# Patient Record
Sex: Female | Born: 1995 | Race: Asian | Hispanic: No | Marital: Married | Smoking: Never smoker
Health system: Southern US, Community
[De-identification: ages and names within clinical notes are randomized; demographics above are authoritative.]

## PROBLEM LIST (undated history)

## (undated) DIAGNOSIS — O24419 Gestational diabetes mellitus in pregnancy, unspecified control: Secondary | ICD-10-CM

## (undated) DIAGNOSIS — E119 Type 2 diabetes mellitus without complications: Secondary | ICD-10-CM

## (undated) DIAGNOSIS — O139 Gestational [pregnancy-induced] hypertension without significant proteinuria, unspecified trimester: Secondary | ICD-10-CM

## (undated) DIAGNOSIS — E282 Polycystic ovarian syndrome: Secondary | ICD-10-CM

## (undated) DIAGNOSIS — N979 Female infertility, unspecified: Secondary | ICD-10-CM

## (undated) HISTORY — DX: Female infertility, unspecified: N97.9

## (undated) HISTORY — DX: Type 2 diabetes mellitus without complications: E11.9

## (undated) HISTORY — PX: CERVICAL CERCLAGE: SHX1329

## (undated) SURGERY — SALPINGECTOMY, UNILATERAL, LAPAROSCOPIC
Anesthesia: General | Laterality: Right

---

## 2019-10-25 ENCOUNTER — Other Ambulatory Visit: Payer: Self-pay

## 2019-10-25 ENCOUNTER — Ambulatory Visit (INDEPENDENT_AMBULATORY_CARE_PROVIDER_SITE_OTHER): Payer: Self-pay | Admitting: Family Medicine

## 2019-10-25 VITALS — BP 122/62 | HR 90 | Wt 174.8 lb

## 2019-10-25 DIAGNOSIS — N926 Irregular menstruation, unspecified: Secondary | ICD-10-CM

## 2019-10-25 DIAGNOSIS — Z7689 Persons encountering health services in other specified circumstances: Secondary | ICD-10-CM

## 2019-10-25 LAB — POCT URINE PREGNANCY: Preg Test, Ur: NEGATIVE

## 2019-10-25 MED ORDER — FOLIC ACID 1 MG PO TABS: 1.0000 mg | ORAL_TABLET | Freq: Every day | ORAL | 3 refills | Status: DC

## 2019-10-25 NOTE — Patient Instructions (Addendum)
Thank you for coming to see me today. It was a pleasure. Today we talked about:   You will have some blood work today.  We will discuss them at your next visit. We will schedule you for an pelvic ultrasound Please book an appointment for a PAP smear with me next week.  I have ordered Folic Acid for you to take daily. You can start Prenatal Vitamins but this can wait until we have completed our evaluation  If you have any questions or concerns, please do not hesitate to call the office at [phone].  Best,   Dana Allan, MD Family Medicine Residency

## 2019-10-25 NOTE — Progress Notes (Signed)
    SUBJECTIVE:   CHIEF COMPLAINT / HPI: To establish care and would like work-up for infertility.  Patient has no acute concerns today.  Secondary amenorrhea Patient reports menses at age 24 to 24 years.  Reports regular periods lasting 6 to 7 days until the age of 24 when became more irregular.  Longest time gone without having bleeding approximately 2 months.  She reports her and her husband have been trying to get pregnant for over a year now.  Per chart review she was seen by OBGYN 2019 for same concern.  Work-up at that time showed elevated prolactin levels and MRI head was ordered but never completed.  Was ordered progesterone at that time but had only taking it for about 7 days.  She reports recent weight gain of about 3 kg over the past 1-2 years.  Denies any hirsutism, dysuria, or abdominal pain.  Sexually active with husband.  No history of sexual transmitted infections.  PERTINENT  PMH / PSH:  No past medical history No surgical history OB history as above No tobacco use.  Drinks a glass of wine socially. OBJECTIVE:   BP 122/62   Pulse 90   Wt 174 lb 12.8 oz (79.3 kg)   SpO2 98%    General: Alert and oriented, no apparent distress  Eyes: PEERLA ENTM: No pharyengeal erythema Neck: nontender, no goiter appreciated Cardiovascular: RRR with no murmurs noted Respiratory: CTA bilaterally  Gastrointestinal: Bowel sounds present. No abdominal pain MSK: Upper extremity strength 5/5 bilaterally, Lower extremity strength 5/5 bilaterally  Derm: No rashes noted Psych: Behavior and speech appropriate to situation  ASSESSMENT/PLAN:   Encounter to establish care Benign exam. -Urinary pregnancy test negative -Labs today  TSH, FSH/LH, prolactin level -Transvaginal ultrasound -Folate acid daily -Advised patient make ointment for Pap smear next week. -Progestin challenge test at visit -Consider semen analysis and hysterosalpingogram after initial work-up.     Dana Allan,  MD [facility]

## 2019-10-26 LAB — FSH/LH
FSH: 2.2 m[IU]/mL
LH: 17.3 m[IU]/mL

## 2019-10-26 LAB — PROLACTIN: Prolactin: 37.7 ng/mL — ABNORMAL HIGH (ref 4.8–23.3)

## 2019-10-26 LAB — TSH: TSH: 2.44 u[IU]/mL (ref 0.450–4.500)

## 2019-10-29 ENCOUNTER — Encounter: Payer: Self-pay | Admitting: Family Medicine

## 2019-10-29 DIAGNOSIS — Z7689 Persons encountering health services in other specified circumstances: Secondary | ICD-10-CM | POA: Insufficient documentation

## 2019-10-29 NOTE — Assessment & Plan Note (Signed)
Benign exam. -Urinary pregnancy test negative -Labs today  TSH, FSH/LH, prolactin level -Transvaginal ultrasound -Folate acid daily -Advised patient make ointment for Pap smear next week. -Progestin challenge test at visit -Consider semen analysis and hysterosalpingogram after initial work-up.

## 2019-11-01 ENCOUNTER — Ambulatory Visit (HOSPITAL_COMMUNITY)
Admission: RE | Admit: 2019-11-01 | Discharge: 2019-11-01 | Disposition: A | Discharge status: Home / Self Care | Payer: 59 | Source: Ambulatory Visit | Attending: Family Medicine | Admitting: Family Medicine

## 2019-11-01 DIAGNOSIS — N926 Irregular menstruation, unspecified: Secondary | ICD-10-CM | POA: Diagnosis present

## 2019-11-05 NOTE — Progress Notes (Signed)
° ° °  SUBJECTIVE:   CHIEF COMPLAINT / HPI: For Pap Smear  No acute concerns.  Denies any vaginal bleeding or discharge.  Recent pregnany test negative 2 weeks ago.  Since then has had period.    Elevated Prolactin Results of labs and recent pelvic u/s discussed.  Prolactin slightly above normal range.  Patient denies any headaches or visual disturbances.  Ultrasound shows mild ovarian enlargement bilaterally with multiple peripherally oriented follicles, which can be seen in the setting of PCOS.  PERTINENT  PMH / PSH:  Irregular menses Elevated prolactin  OBJECTIVE:   BP 100/80    Pulse 84    Ht 5' 3.98" (1.625 m)    Wt 175 lb 6.4 oz (79.6 kg)    LMP 09/17/2019 (Approximate)    SpO2 99%    BMI 30.13 kg/m   General: Alert and oriented, no apparent distress  Gastrointestinal: Bowel sounds present. No abdominal pain EVE: No lesions or abrasions noted SVE: Cervix normal with normal vaginal mucosa Bimanual: No CMT or adnexal masses  ASSESSMENT/PLAN:   Cervical cancer screening -PAP for cytology -GC/C -Repeat in 3 years -Follow up as needed   Elevated prolactin level Prolactin slightly above normal in the past 2 years.  Visual acuity 20/20.  Unable to achieve pregnancy. -MRI brain to r/o adenoma -Follow up with results     Dana Allan, MD [facility]

## 2019-11-05 NOTE — Patient Instructions (Addendum)
Thank you for coming to see me today. It was a pleasure.    Your Prolactin levels are elevated.  We will order a scan of your head to check the pituitary gland.  Please follow-up with PCP in 4 weeks  If you have any questions or concerns, please do not hesitate to call the office at [phone].  Best,   Dana Allan, MD Family Medicine Residency    Polycystic Ovarian Syndrome  Polycystic ovarian syndrome (PCOS) is a common hormonal disorder among women of reproductive age. In most women with PCOS, many small fluid-filled sacs (cysts) grow on the ovaries, and the cysts are not part of a normal menstrual cycle. PCOS can cause problems with your menstrual periods and make it difficult to get pregnant. It can also cause an increased risk of miscarriage with pregnancy. If it is not treated, PCOS can lead to serious health problems, such as diabetes and heart disease. What are the causes? The cause of PCOS is not known, but it may be the result of a combination of certain factors, such as:  Irregular menstrual cycle.  High levels of certain hormones (androgens).  Problems with the hormone that helps to control blood sugar (insulin resistance).  Certain genes. What increases the risk? This condition is more likely to develop in women who have a family history of PCOS. What are the signs or symptoms? Symptoms of PCOS may include:  Multiple ovarian cysts.  Infrequent periods or no periods.  Periods that are too frequent or too heavy.  Unpredictable periods.  Inability to get pregnant (infertility) because of not ovulating.  Increased growth of hair on the face, chest, stomach, back, thumbs, thighs, or toes.  Acne or oily skin. Acne may develop during adulthood, and it may not respond to treatment.  Pelvic pain.  Weight gain or obesity.  Patches of thickened and dark brown or black skin on the neck, arms, breasts, or thighs (acanthosis nigricans).  Excess hair growth on  the face, chest, abdomen, or upper thighs (hirsutism). How is this diagnosed? This condition is diagnosed based on:  Your medical history.  A physical exam, including a pelvic exam. Your health care provider may look for areas of increased hair growth on your skin.  Tests, such as: ? Ultrasound. This may be used to examine the ovaries and the lining of the uterus (endometrium) for cysts. ? Blood tests. These may be used to check levels of sugar (glucose), female hormone (testosterone), and female hormones (estrogen and progesterone) in your blood. How is this treated? There is no cure for PCOS, but treatment can help to manage symptoms and prevent more health problems from developing. Treatment varies depending on:  Your symptoms.  Whether you want to have a baby or whether you need birth control (contraception). Treatment may include nutrition and lifestyle changes along with:  Progesterone hormone to start a menstrual period.  Birth control pills to help you have regular menstrual periods.  Medicines to make you ovulate, if you want to get pregnant.  Medicine to reduce excessive hair growth.  Surgery, in severe cases. This may involve making small holes in one or both of your ovaries. This decreases the amount of testosterone that your body produces. Follow these instructions at home:  Take over-the-counter and prescription medicines only as told by your health care provider.  Follow a healthy meal plan. This can help you reduce the effects of PCOS. ? Eat a healthy diet that includes lean proteins, complex carbohydrates,  fresh fruits and vegetables, low-fat dairy products, and healthy fats. Make sure to eat enough fiber.  If you are overweight, lose weight as told by your health care provider. ? Losing 10% of your body weight may improve symptoms. ? Your health care provider can determine how much weight loss is best for you and can help you lose weight safely.  Keep all  follow-up visits as told by your health care provider. This is important. Contact a health care provider if:  Your symptoms do not get better with medicine.  You develop new symptoms. This information is not intended to replace advice given to you by your health care provider. Make sure you discuss any questions you have with your health care provider. Document Revised: 03/17/2017 Document Reviewed: 09/20/2015 Elsevier Patient Education  2020 [employer]   Diet for Polycystic Ovary Syndrome Polycystic ovary syndrome (PCOS) is a disorder of the chemicals (hormones) that regulate a woman's reproductive system, including monthly periods (menstruation). The condition causes important hormones to be out of balance. PCOS can:  Stop your periods or make them irregular.  Cause cysts to develop on your ovaries.  Make it difficult to get pregnant.  Stop your body from responding to the effects of insulin (insulin resistance). Insulin resistance can lead to obesity and diabetes. Changing what you eat can help you manage PCOS and improve your health. Following a balanced diet can help you lose weight and improve the way that your body uses insulin. What are tips for following this plan?  Follow a balanced diet for meals and snacks. Eat breakfast, lunch, dinner, and one or two snacks every day.  Include protein in each meal and snack.  Choose whole grains instead of products that are made with refined flour.  Eat a variety of foods.  Exercise regularly as told by your health care provider. Aim to do 30 or more minutes of exercise on most days of the week.  If you are overweight or obese: ? Pay attention to how many calories you eat. Cutting down on calories can help you lose weight. ? Work with your health care provider or a diet and nutrition specialist (dietitian) to figure out how many calories you need each day. What foods can I eat?  Fruits Include a variety of colors and types.  All fruits are helpful for PCOS. Vegetables Include a variety of colors and types. All vegetables are helpful for PCOS. Grains Whole grains, such as whole wheat. Whole-grain breads, crackers, cereals, and pasta. Unsweetened oatmeal, bulgur, barley, quinoa, and brown rice. Tortillas made from corn or whole-wheat flour. Meats and other proteins Low-fat (lean) proteins, such as fish, chicken, beans, eggs, and tofu. Dairy Low-fat dairy products, such as skim milk, cheese sticks, and yogurt. Beverages Low-fat or fat-free drinks, such as water, low-fat milk, sugar-free drinks, and small amounts of 100% fruit juice. Seasonings and condiments Ketchup. Mustard. Barbecue sauce. Relish. Low-fat or fat-free mayonnaise. Fats and oils Olive oil or canola oil. Walnuts and almonds. The items listed above may not be a complete list of recommended foods and beverages. Contact a dietitian for more options. What foods are not recommended? Foods that are high in calories or fat. Fried foods. Sweets. Products that are made from refined white flour, including white bread, pastries, white rice, and pasta. The items listed above may not be a complete list of foods and beverages to avoid. Contact a dietitian for more information. Summary  PCOS is a hormonal imbalance that affects a  woman's reproductive system.  You can help to manage your PCOS by exercising regularly and eating a healthy, varied diet of vegetables, fruit, whole grains, low-fat (lean) protein, and low-fat dairy products.  Changing what you eat can improve the way that your body uses insulin, help your hormones reach normal levels, and help you lose weight. This information is not intended to replace advice given to you by your health care provider. Make sure you discuss any questions you have with your health care provider. Document Revised: 07/25/2018 Document Reviewed: 02/06/2017 Elsevier Patient Education  2020 [employer].

## 2019-11-06 ENCOUNTER — Other Ambulatory Visit (HOSPITAL_COMMUNITY)
Admission: RE | Admit: 2019-11-06 | Discharge: 2019-11-06 | Disposition: A | Discharge status: Home / Self Care | Payer: 59 | Source: Ambulatory Visit | Attending: Family Medicine | Admitting: Family Medicine

## 2019-11-06 ENCOUNTER — Ambulatory Visit (INDEPENDENT_AMBULATORY_CARE_PROVIDER_SITE_OTHER): Payer: 59 | Admitting: Family Medicine

## 2019-11-06 ENCOUNTER — Other Ambulatory Visit: Payer: Self-pay

## 2019-11-06 VITALS — BP 100/80 | HR 84 | Ht 63.98 in | Wt 175.4 lb

## 2019-11-06 DIAGNOSIS — Z124 Encounter for screening for malignant neoplasm of cervix: Secondary | ICD-10-CM

## 2019-11-06 DIAGNOSIS — R7989 Other specified abnormal findings of blood chemistry: Secondary | ICD-10-CM

## 2019-11-06 DIAGNOSIS — Z0000 Encounter for general adult medical examination without abnormal findings: Secondary | ICD-10-CM | POA: Diagnosis not present

## 2019-11-06 DIAGNOSIS — E282 Polycystic ovarian syndrome: Secondary | ICD-10-CM

## 2019-11-07 LAB — HCV INTERPRETATION

## 2019-11-07 LAB — HCV AB W REFLEX TO QUANT PCR: HCV Ab: <0.1 s/co ratio (ref 0.0–0.9)

## 2019-11-07 LAB — HIV ANTIBODY (ROUTINE TESTING W REFLEX): HIV Screen 4th Generation wRfx: NONREACTIVE

## 2019-11-08 LAB — CYTOLOGY - PAP
Chlamydia: NEGATIVE
Comment: NEGATIVE
Comment: NEGATIVE
Comment: NORMAL
Diagnosis: NEGATIVE
Neisseria Gonorrhea: NEGATIVE
Trichomonas: NEGATIVE

## 2019-11-10 DIAGNOSIS — R7989 Other specified abnormal findings of blood chemistry: Secondary | ICD-10-CM | POA: Insufficient documentation

## 2019-11-10 DIAGNOSIS — E282 Polycystic ovarian syndrome: Secondary | ICD-10-CM | POA: Insufficient documentation

## 2019-11-10 DIAGNOSIS — Z124 Encounter for screening for malignant neoplasm of cervix: Secondary | ICD-10-CM | POA: Insufficient documentation

## 2019-11-10 NOTE — Assessment & Plan Note (Signed)
Prolactin slightly above normal in the past 2 years.  Visual acuity 20/20.  Unable to achieve pregnancy. -MRI brain to r/o adenoma -Follow up with results

## 2019-11-10 NOTE — Assessment & Plan Note (Signed)
-  PAP for cytology -GC/C -Repeat in 3 years -Follow up as needed

## 2019-12-09 ENCOUNTER — Ambulatory Visit (HOSPITAL_COMMUNITY)
Admission: AD | Admit: 2019-12-09 | Discharge: 2019-12-09 | Disposition: A | Discharge status: Home / Self Care | Payer: 59 | Attending: Obstetrics and Gynecology | Admitting: Obstetrics and Gynecology

## 2019-12-09 ENCOUNTER — Encounter (HOSPITAL_COMMUNITY)
Admission: AD | Disposition: A | Discharge status: Home / Self Care | Payer: Self-pay | Source: Home / Self Care | Attending: Emergency Medicine

## 2019-12-09 ENCOUNTER — Other Ambulatory Visit: Payer: Self-pay

## 2019-12-09 ENCOUNTER — Inpatient Hospital Stay (HOSPITAL_COMMUNITY): Payer: 59 | Admitting: Anesthesiology

## 2019-12-09 ENCOUNTER — Inpatient Hospital Stay (HOSPITAL_COMMUNITY): Payer: 59

## 2019-12-09 ENCOUNTER — Encounter (HOSPITAL_COMMUNITY): Payer: Self-pay

## 2019-12-09 ENCOUNTER — Ambulatory Visit: Payer: 59 | Admitting: Family Medicine

## 2019-12-09 DIAGNOSIS — Z20822 Contact with and (suspected) exposure to covid-19: Secondary | ICD-10-CM | POA: Insufficient documentation

## 2019-12-09 DIAGNOSIS — Z3A01 Less than 8 weeks gestation of pregnancy: Secondary | ICD-10-CM | POA: Diagnosis not present

## 2019-12-09 DIAGNOSIS — Z3201 Encounter for pregnancy test, result positive: Secondary | ICD-10-CM | POA: Diagnosis present

## 2019-12-09 DIAGNOSIS — O00101 Right tubal pregnancy without intrauterine pregnancy: Secondary | ICD-10-CM | POA: Diagnosis not present

## 2019-12-09 DIAGNOSIS — Z8759 Personal history of other complications of pregnancy, childbirth and the puerperium: Secondary | ICD-10-CM

## 2019-12-09 DIAGNOSIS — R102 Pelvic and perineal pain: Secondary | ICD-10-CM

## 2019-12-09 DIAGNOSIS — E282 Polycystic ovarian syndrome: Secondary | ICD-10-CM | POA: Insufficient documentation

## 2019-12-09 DIAGNOSIS — K661 Hemoperitoneum: Secondary | ICD-10-CM | POA: Diagnosis not present

## 2019-12-09 DIAGNOSIS — R1030 Lower abdominal pain, unspecified: Secondary | ICD-10-CM | POA: Diagnosis present

## 2019-12-09 DIAGNOSIS — O009 Unspecified ectopic pregnancy without intrauterine pregnancy: Secondary | ICD-10-CM

## 2019-12-09 DIAGNOSIS — O26891 Other specified pregnancy related conditions, first trimester: Secondary | ICD-10-CM | POA: Insufficient documentation

## 2019-12-09 DIAGNOSIS — O99281 Endocrine, nutritional and metabolic diseases complicating pregnancy, first trimester: Secondary | ICD-10-CM | POA: Diagnosis not present

## 2019-12-09 HISTORY — PX: LAPAROSCOPIC UNILATERAL SALPINGECTOMY: SHX5934

## 2019-12-09 HISTORY — PX: DIAGNOSTIC LAPAROSCOPY WITH REMOVAL OF ECTOPIC PREGNANCY: SHX6449

## 2019-12-09 HISTORY — DX: Polycystic ovarian syndrome: E28.2

## 2019-12-09 LAB — COMPREHENSIVE METABOLIC PANEL
ALT: 18 U/L (ref 0–44)
AST: 18 U/L (ref 15–41)
Albumin: 3.8 g/dL (ref 3.5–5.0)
Alkaline Phosphatase: 42 U/L (ref 38–126)
Anion gap: 9 (ref 5–15)
BUN: 5 mg/dL — ABNORMAL LOW (ref 6–20)
CO2: 21 mmol/L — ABNORMAL LOW (ref 22–32)
Calcium: 9 mg/dL (ref 8.9–10.3)
Chloride: 109 mmol/L (ref 98–111)
Creatinine, Ser: 0.47 mg/dL (ref 0.44–1.00)
GFR calc Af Amer: >60 mL/min (ref >60)
GFR calc non Af Amer: >60 mL/min (ref >60)
Glucose, Bld: 123 mg/dL — ABNORMAL HIGH (ref 70–99)
Potassium: 3.9 mmol/L (ref 3.5–5.1)
Sodium: 139 mmol/L (ref 135–145)
Total Bilirubin: 0.7 mg/dL (ref 0.3–1.2)
Total Protein: 6.8 g/dL (ref 6.5–8.1)

## 2019-12-09 LAB — WET PREP, GENITAL
Clue Cells Wet Prep HPF POC: NONE SEEN
Sperm: NONE SEEN
Trich, Wet Prep: NONE SEEN
Yeast Wet Prep HPF POC: NONE SEEN

## 2019-12-09 LAB — TYPE AND SCREEN
ABO/RH(D): B POS
Antibody Screen: NEGATIVE

## 2019-12-09 LAB — URINALYSIS, ROUTINE W REFLEX MICROSCOPIC
Bilirubin Urine: NEGATIVE
Glucose, UA: NEGATIVE mg/dL
Ketones, ur: NEGATIVE mg/dL
Nitrite: NEGATIVE
Protein, ur: 30 mg/dL — AB
Specific Gravity, Urine: 1.025 (ref 1.005–1.030)
pH: 7 (ref 5.0–8.0)

## 2019-12-09 LAB — URINALYSIS, MICROSCOPIC (REFLEX): WBC, UA: >50 WBC/hpf (ref 0–5)

## 2019-12-09 LAB — LIPASE, BLOOD: Lipase: 26 U/L (ref 11–51)

## 2019-12-09 LAB — I-STAT BETA HCG BLOOD, ED (MC, WL, AP ONLY): I-stat hCG, quantitative: >2000 m[IU]/mL — ABNORMAL HIGH (ref <5)

## 2019-12-09 LAB — HCG, QUANTITATIVE, PREGNANCY: hCG, Beta Chain, Quant, S: 18318 m[IU]/mL — ABNORMAL HIGH

## 2019-12-09 LAB — CBC
HCT: 32.8 % — ABNORMAL LOW (ref 36.0–46.0)
Hemoglobin: 10.7 g/dL — ABNORMAL LOW (ref 12.0–15.0)
MCH: 27.6 pg (ref 26.0–34.0)
MCHC: 32.6 g/dL (ref 30.0–36.0)
MCV: 84.8 fL (ref 80.0–100.0)
Platelets: 356 K/uL (ref 150–400)
RBC: 3.87 MIL/uL (ref 3.87–5.11)
RDW: 13 % (ref 11.5–15.5)
WBC: 14 K/uL — ABNORMAL HIGH (ref 4.0–10.5)
nRBC: 0 % (ref 0.0–0.2)

## 2019-12-09 LAB — SARS CORONAVIRUS 2 BY RT PCR (HOSPITAL ORDER, PERFORMED IN ~~LOC~~ HOSPITAL LAB): SARS Coronavirus 2: NEGATIVE

## 2019-12-09 LAB — ABO/RH: ABO/RH(D): B POS

## 2019-12-09 SURGERY — LAPAROSCOPY, WITH ECTOPIC PREGNANCY SURGICAL TREATMENT
Anesthesia: General | Laterality: Right

## 2019-12-09 MED ORDER — BUPIVACAINE HCL 0.25 % IJ SOLN
INTRAMUSCULAR | Status: DC | PRN
  Administered 2019-12-09: 14 mL

## 2019-12-09 MED ORDER — SUFENTANIL CITRATE 50 MCG/ML IV SOLN
INTRAVENOUS | Status: AC
  Filled 2019-12-09: qty 1

## 2019-12-09 MED ORDER — AMISULPRIDE (ANTIEMETIC) 5 MG/2ML IV SOLN: 10.0000 mg | Freq: Once | INTRAVENOUS | Status: DC | PRN

## 2019-12-09 MED ORDER — POVIDONE-IODINE 10 % EX SWAB: 2.0000 "application " | Freq: Once | CUTANEOUS | Status: DC

## 2019-12-09 MED ORDER — SODIUM CHLORIDE 0.9 % IR SOLN
Status: DC | PRN
  Administered 2019-12-09: 1000 mL

## 2019-12-09 MED ORDER — GLYCOPYRROLATE 0.2 MG/ML IJ SOLN
INTRAMUSCULAR | Status: DC | PRN
  Administered 2019-12-09: .2 mg via INTRAVENOUS

## 2019-12-09 MED ORDER — BUPIVACAINE HCL (PF) 0.25 % IJ SOLN
INTRAMUSCULAR | Status: AC
  Filled 2019-12-09: qty 30

## 2019-12-09 MED ORDER — ROCURONIUM 10MG/ML (10ML) SYRINGE FOR MEDFUSION PUMP - OPTIME
INTRAVENOUS | Status: DC | PRN
  Administered 2019-12-09: 50 mg via INTRAVENOUS

## 2019-12-09 MED ORDER — SCOPOLAMINE 1 MG/3DAYS TD PT72
MEDICATED_PATCH | TRANSDERMAL | Status: AC
  Administered 2019-12-09: 1.5 mg via TRANSDERMAL
  Filled 2019-12-09: qty 1

## 2019-12-09 MED ORDER — ACETAMINOPHEN 500 MG PO TABS
ORAL_TABLET | ORAL | Status: AC
  Administered 2019-12-09: 1000 mg via ORAL
  Filled 2019-12-09: qty 2

## 2019-12-09 MED ORDER — MIDAZOLAM HCL 2 MG/2ML IJ SOLN
INTRAMUSCULAR | Status: AC
  Filled 2019-12-09: qty 2

## 2019-12-09 MED ORDER — ONDANSETRON HCL 4 MG/2ML IJ SOLN
INTRAMUSCULAR | Status: DC | PRN
  Administered 2019-12-09: 4 mg via INTRAVENOUS

## 2019-12-09 MED ORDER — SUFENTANIL CITRATE 50 MCG/ML IV SOLN
INTRAVENOUS | Status: DC | PRN
Start: 2019-12-09 — End: 2019-12-09
  Administered 2019-12-09 (×2): 10 ug via INTRAVENOUS
  Administered 2019-12-09: 20 ug via INTRAVENOUS

## 2019-12-09 MED ORDER — CHLORHEXIDINE GLUCONATE 0.12 % MT SOLN: 15.0000 mL | Freq: Once | OROMUCOSAL | Status: AC

## 2019-12-09 MED ORDER — MIDAZOLAM HCL 5 MG/5ML IJ SOLN
INTRAMUSCULAR | Status: DC | PRN
  Administered 2019-12-09: 2 mg via INTRAVENOUS

## 2019-12-09 MED ORDER — FENTANYL CITRATE (PF) 100 MCG/2ML IJ SOLN: 25.0000 ug | INTRAMUSCULAR | Status: DC | PRN

## 2019-12-09 MED ORDER — LACTATED RINGERS IV SOLN: Freq: Once | INTRAVENOUS | Status: AC

## 2019-12-09 MED ORDER — HYDROCODONE-ACETAMINOPHEN 7.5-325 MG PO TABS: 1.0000 | ORAL_TABLET | Freq: Four times a day (QID) | ORAL | 0 refills | Status: AC | PRN

## 2019-12-09 MED ORDER — LACTATED RINGERS IV SOLN: INTRAVENOUS | Status: DC

## 2019-12-09 MED ORDER — SUGAMMADEX SODIUM 200 MG/2ML IV SOLN
INTRAVENOUS | Status: DC | PRN
  Administered 2019-12-09: 200 mg via INTRAVENOUS

## 2019-12-09 MED ORDER — LACTATED RINGERS IV SOLN: INTRAVENOUS | Status: DC | PRN

## 2019-12-09 MED ORDER — LIDOCAINE HCL (CARDIAC) PF 100 MG/5ML IV SOSY
PREFILLED_SYRINGE | INTRAVENOUS | Status: DC | PRN
  Administered 2019-12-09: 100 mg via INTRAVENOUS

## 2019-12-09 MED ORDER — IBUPROFEN 600 MG PO TABS: 600.0000 mg | ORAL_TABLET | Freq: Four times a day (QID) | ORAL | 1 refills | Status: DC | PRN

## 2019-12-09 MED ORDER — ACETAMINOPHEN 500 MG PO TABS: 1000.0000 mg | ORAL_TABLET | ORAL | Status: AC

## 2019-12-09 MED ORDER — PROPOFOL 10 MG/ML IV BOLUS
INTRAVENOUS | Status: AC
  Filled 2019-12-09: qty 20

## 2019-12-09 MED ORDER — GLYCOPYRROLATE PF 0.2 MG/ML IJ SOSY
PREFILLED_SYRINGE | INTRAMUSCULAR | Status: AC
  Filled 2019-12-09: qty 1

## 2019-12-09 MED ORDER — SOD CITRATE-CITRIC ACID 500-334 MG/5ML PO SOLN: 30.0000 mL | ORAL | Status: AC

## 2019-12-09 MED ORDER — LIDOCAINE 2% (20 MG/ML) 5 ML SYRINGE
INTRAMUSCULAR | Status: AC
  Filled 2019-12-09: qty 5

## 2019-12-09 MED ORDER — ONDANSETRON HCL 4 MG/2ML IJ SOLN
INTRAMUSCULAR | Status: AC
  Filled 2019-12-09: qty 2

## 2019-12-09 MED ORDER — CEFAZOLIN SODIUM-DEXTROSE 2-4 GM/100ML-% IV SOLN
INTRAVENOUS | Status: AC
  Filled 2019-12-09: qty 100

## 2019-12-09 MED ORDER — ROCURONIUM BROMIDE 10 MG/ML (PF) SYRINGE
PREFILLED_SYRINGE | INTRAVENOUS | Status: AC
  Filled 2019-12-09: qty 10

## 2019-12-09 MED ORDER — SODIUM CHLORIDE (PF) 0.9 % IJ SOLN
INTRAMUSCULAR | Status: AC
  Filled 2019-12-09: qty 10

## 2019-12-09 MED ORDER — DEXAMETHASONE SODIUM PHOSPHATE 10 MG/ML IJ SOLN
INTRAMUSCULAR | Status: AC
  Filled 2019-12-09: qty 1

## 2019-12-09 MED ORDER — SOD CITRATE-CITRIC ACID 500-334 MG/5ML PO SOLN
ORAL | Status: AC
  Administered 2019-12-09: 30 mL via ORAL
  Filled 2019-12-09: qty 30

## 2019-12-09 MED ORDER — CHLORHEXIDINE GLUCONATE 0.12 % MT SOLN
OROMUCOSAL | Status: AC
  Administered 2019-12-09: 15 mL via OROMUCOSAL
  Filled 2019-12-09: qty 15

## 2019-12-09 MED ORDER — CEFAZOLIN SODIUM-DEXTROSE 2-4 GM/100ML-% IV SOLN
2.0000 g | INTRAVENOUS | Status: AC
  Administered 2019-12-09: 2 g via INTRAVENOUS

## 2019-12-09 MED ORDER — DEXAMETHASONE SODIUM PHOSPHATE 10 MG/ML IJ SOLN
INTRAMUSCULAR | Status: DC | PRN
  Administered 2019-12-09: 10 mg via INTRAVENOUS

## 2019-12-09 MED ORDER — SCOPOLAMINE 1 MG/3DAYS TD PT72: 1.0000 | MEDICATED_PATCH | TRANSDERMAL | Status: DC

## 2019-12-09 MED ORDER — PROPOFOL 10 MG/ML IV BOLUS
INTRAVENOUS | Status: DC | PRN
  Administered 2019-12-09: 150 mg via INTRAVENOUS

## 2019-12-09 SURGICAL SUPPLY — 33 items
APPLICATOR ARISTA FLEXITIP XL (MISCELLANEOUS) IMPLANT
DERMABOND ADVANCED (GAUZE/BANDAGES/DRESSINGS) ×2
DERMABOND ADVANCED .7 DNX12 (GAUZE/BANDAGES/DRESSINGS) ×2 IMPLANT
DRSG OPSITE POSTOP 3X4 (GAUZE/BANDAGES/DRESSINGS) ×4 IMPLANT
DURAPREP 26ML APPLICATOR (WOUND CARE) ×4 IMPLANT
GLOVE BIO SURGEON STRL SZ8 (GLOVE) ×4 IMPLANT
GLOVE BIOGEL PI IND STRL 7.0 (GLOVE) ×4 IMPLANT
GLOVE BIOGEL PI IND STRL 8 (GLOVE) ×2 IMPLANT
GLOVE BIOGEL PI INDICATOR 7.0 (GLOVE) ×4
GLOVE BIOGEL PI INDICATOR 8 (GLOVE) ×2
GOWN STRL REUS W/ TWL LRG LVL3 (GOWN DISPOSABLE) ×2 IMPLANT
GOWN STRL REUS W/ TWL XL LVL3 (GOWN DISPOSABLE) ×2 IMPLANT
GOWN STRL REUS W/TWL LRG LVL3 (GOWN DISPOSABLE) ×2
GOWN STRL REUS W/TWL XL LVL3 (GOWN DISPOSABLE) ×2
HEMOSTAT ARISTA ABSORB 3G PWDR (HEMOSTASIS) IMPLANT
KIT TURNOVER KIT B (KITS) ×4 IMPLANT
LIGASURE VESSEL 5MM BLUNT TIP (ELECTROSURGICAL) ×4 IMPLANT
NS IRRIG 1000ML POUR BTL (IV SOLUTION) ×4 IMPLANT
PACK LAPAROSCOPY BASIN (CUSTOM PROCEDURE TRAY) ×4 IMPLANT
PACK TRENDGUARD 450 HYBRID PRO (MISCELLANEOUS) ×2 IMPLANT
POUCH SPECIMEN RETRIEVAL 10MM (ENDOMECHANICALS) IMPLANT
PROTECTOR NERVE ULNAR (MISCELLANEOUS) ×8 IMPLANT
SET IRRIG TUBING LAPAROSCOPIC (IRRIGATION / IRRIGATOR) ×4 IMPLANT
SET TUBE SMOKE EVAC HIGH FLOW (TUBING) ×4 IMPLANT
SLEEVE XCEL OPT CAN 5 100 (ENDOMECHANICALS) ×4 IMPLANT
SUT MNCRL AB 4-0 PS2 18 (SUTURE) ×4 IMPLANT
SUT VICRYL 0 UR6 27IN ABS (SUTURE) ×4 IMPLANT
TOWEL GREEN STERILE FF (TOWEL DISPOSABLE) ×8 IMPLANT
TRAY FOLEY W/BAG SLVR 14FR (SET/KITS/TRAYS/PACK) ×4 IMPLANT
TRENDGUARD 450 HYBRID PRO PACK (MISCELLANEOUS) ×4
TROCAR XCEL NON-BLD 11X100MML (ENDOMECHANICALS) ×4 IMPLANT
TROCAR XCEL NON-BLD 5MMX100MML (ENDOMECHANICALS) ×4 IMPLANT
WARMER LAPAROSCOPE (MISCELLANEOUS) ×4 IMPLANT

## 2019-12-09 NOTE — ED Triage Notes (Signed)
Patient complains of abdominal pain that she describes as lower since yesterday at 6pm. No nausea, no vomiting, no diarrhea

## 2019-12-09 NOTE — MAU Note (Signed)
Pain in lower abd for 2 hrs last night, then returned- was off and on during the night, was unable to sleep.  Was unaware she was preg,  No bleeding.  Has been feeling dizzy.

## 2019-12-09 NOTE — ED Notes (Signed)
Spoke with MAU after provider completed screening and able to go to MAU.

## 2019-12-09 NOTE — MAU Provider Note (Signed)
History     CSN: [id number]  Arrival date and time: 12/09/19 0840   First Provider Initiated Contact with Patient 12/09/19 1448      No chief complaint on file.  HPI Kathryn Navarro is a 24 y.o. G1P0 in early pregnancy who presents to [facility]U from [facility] for evaluation of generalized abdominal pain and vaginal pain. These are new problems, onset last night. Patient denies aggravating or alleviating factors. She states she also feels an intense amount of vaginal pressure "like something is trying to come out". She denies vaginal bleeding, dysuria, fever or recent illness. She endorses history of abdominal pain and infertility in the setting of PCOS.  Patient states she has only had sips of water since dinner last night.  OB History    Gravida  1   Para      Term      Preterm      AB      Living        SAB      TAB      Ectopic      Multiple      Live Births              Past Medical History:  Diagnosis Date  . PCOS (polycystic ovarian syndrome)     History reviewed. No pertinent surgical history.  History reviewed. No pertinent family history.  Social History   Tobacco Use  . Smoking status: Never Smoker  . Smokeless tobacco: Never Used  Substance Use Topics  . Alcohol use: Not Currently  . Drug use: Never    Allergies: No Known Allergies  Medications Prior to Admission  Medication Sig Dispense Refill Last Dose  . folic acid (FOLVITE) 1 MG tablet Take 1 tablet (1 mg total) by mouth daily. 90 tablet 3 12/08/2019 at Unknown time    Review of Systems  Gastrointestinal: Positive for abdominal pain.  All other systems reviewed and are negative.  Physical Exam   Blood pressure 129/78, pulse (!) 108, temperature 98.5 F (36.9 C), temperature source Oral, resp. rate 17, height 5\' 4"  (1.626 m), weight 78.5 kg, last menstrual period 11/07/2019, SpO2 100 %.  Physical Exam Vitals and nursing note reviewed. Exam conducted with a chaperone present.   Constitutional:      Appearance: Normal appearance.  Cardiovascular:     Rate and Rhythm: Tachycardia present.     Pulses: Normal pulses.     Heart sounds: Normal heart sounds.  Pulmonary:     Effort: Pulmonary effort is normal.     Breath sounds: Normal breath sounds.  Abdominal:     General: Abdomen is flat. Bowel sounds are normal. There is no distension.     Tenderness: There is no abdominal tenderness. There is no right CVA tenderness or left CVA tenderness.  Genitourinary:    Comments: Pelvic exam: External genitalia normal, vaginal walls pink and well rugated, cervix visually closed, no lesions noted. Scant yellow-tinged discharge throughout vaginal vault.  Skin:    General: Skin is warm and dry.     Capillary Refill: Capillary refill takes less than 2 seconds.  Neurological:     General: No focal deficit present.     Mental Status: She is alert.  Psychiatric:        Mood and Affect: Mood normal.     MAU Course  Procedures  Orders Placed This Encounter  Procedures  . Wet prep, genital  . 11/09/2019 OB LESS THAN 14 WEEKS WITH  OB TRANSVAGINAL  . Lipase, blood  . Comprehensive metabolic panel  . CBC  . Urinalysis, Routine w reflex microscopic  . Urinalysis, Microscopic (reflex)  . hCG, quantitative, pregnancy  . Diet NPO time specified  . I-Stat beta hCG blood, ED  . ABO/Rh   Results for orders placed or performed during the hospital encounter of 12/09/19 (from the past 24 hour(s))  Lipase, blood     Status: None   Collection Time: 12/09/19  8:58 AM  Result Value Ref Range   Lipase 26 11 - 51 U/L  Comprehensive metabolic panel     Status: Abnormal   Collection Time: 12/09/19  8:58 AM  Result Value Ref Range   Sodium 139 135 - 145 mmol/L   Potassium 3.9 3.5 - 5.1 mmol/L   Chloride 109 98 - 111 mmol/L   CO2 21 (L) 22 - 32 mmol/L   Glucose, Bld 123 (H) 70 - 99 mg/dL   BUN 5 (L) 6 - 20 mg/dL   Creatinine, Ser 7.82 0.44 - 1.00 mg/dL   Calcium 9.0 8.9 - 95.6 mg/dL    Total Protein 6.8 6.5 - 8.1 g/dL   Albumin 3.8 3.5 - 5.0 g/dL   AST 18 15 - 41 U/L   ALT 18 0 - 44 U/L   Alkaline Phosphatase 42 38 - 126 U/L   Total Bilirubin 0.7 0.3 - 1.2 mg/dL   GFR calc non Af Amer >60 >60 mL/min   GFR calc Af Amer >60 >60 mL/min   Anion gap 9 5 - 15  CBC     Status: Abnormal   Collection Time: 12/09/19  8:58 AM  Result Value Ref Range   WBC 14.0 (H) 4.0 - 10.5 K/uL   RBC 3.87 3.87 - 5.11 MIL/uL   Hemoglobin 10.7 (L) 12.0 - 15.0 g/dL   HCT 21.3 (L) 36 - 46 %   MCV 84.8 80.0 - 100.0 fL   MCH 27.6 26.0 - 34.0 pg   MCHC 32.6 30.0 - 36.0 g/dL   RDW 08.6 57.8 - 46.9 %   Platelets 356 150 - 400 K/uL   nRBC 0.0 0.0 - 0.2 %  Urinalysis, Routine w reflex microscopic     Status: Abnormal   Collection Time: 12/09/19  8:58 AM  Result Value Ref Range   Color, Urine YELLOW YELLOW   APPearance TURBID (A) CLEAR   Specific Gravity, Urine 1.025 1.005 - 1.030   pH 7.0 5.0 - 8.0   Glucose, UA NEGATIVE NEGATIVE mg/dL   Hgb urine dipstick SMALL (A) NEGATIVE   Bilirubin Urine NEGATIVE NEGATIVE   Ketones, ur NEGATIVE NEGATIVE mg/dL   Protein, ur 30 (A) NEGATIVE mg/dL   Nitrite NEGATIVE NEGATIVE   Leukocytes,Ua LARGE (A) NEGATIVE  Urinalysis, Microscopic (reflex)     Status: Abnormal   Collection Time: 12/09/19  8:58 AM  Result Value Ref Range   RBC / HPF 0-5 0 - 5 RBC/hpf   WBC, UA >50 0 - 5 WBC/hpf   Bacteria, UA MANY (A) NONE SEEN   Squamous Epithelial / LPF 11-20 0 - 5   Non Squamous Epithelial PRESENT (A) NONE SEEN   Urine-Other MICROSCOPIC EXAM PERFORMED ON UNCONCENTRATED URINE   I-Stat beta hCG blood, ED     Status: Abnormal   Collection Time: 12/09/19  9:15 AM  Result Value Ref Range   I-stat hCG, quantitative >2,000.0 (H) <5 mIU/mL   Comment 3          Wet prep, genital  Status: Abnormal   Collection Time: 12/09/19  2:36 PM  Result Value Ref Range   Yeast Wet Prep HPF POC NONE SEEN NONE SEEN   Trich, Wet Prep NONE SEEN NONE SEEN   Clue Cells Wet Prep  HPF POC NONE SEEN NONE SEEN   WBC, Wet Prep HPF POC MANY (A) NONE SEEN   Sperm NONE SEEN   ABO/Rh     Status: None   Collection Time: 12/09/19  3:06 PM  Result Value Ref Range   ABO/RH(D) B POS    No rh immune globuloin      NOT A RH IMMUNE GLOBULIN CANDIDATE, PT RH POSITIVE Performed at [facility], [address], [place], [place] [address]   hCG, quantitative, pregnancy     Status: Abnormal   Collection Time: 12/09/19  3:06 PM  Result Value Ref Range   hCG, Beta Chain, Quant, S 18,318 (H) <5 mIU/mL   US OB LESS THAN 14 WEEKS WITH OB TRANSVAGINAL  Result Date: 12/09/2019 CLINICAL DATA:  Pelvic pain EXAM: OBSTETRIC <14 WK [place] AND TRANSVAGINAL OB US TECHNIQUE: Both transabdominal and transvaginal ultrasound examinations were performed for complete evaluation of the gestation as well as the maternal uterus, adnexal regions, and pelvic cul-de-sac. Transvaginal technique was performed to assess early pregnancy. COMPARISON:  None. FINDINGS: Intrauterine gestational sac: None Yolk sac:  Not Visualized. Embryo:  Not Visualized. Maternal uterus/adnexae: Ovaries are within normal limits. Right ovary measures 2.5 by 3.7 x 3.6 cm. The left ovary measures 3.1 by 3 x 1.9 cm. Heterogenous right adnexal mass with echogenic rim, hypoechoic center and peripheral vascularity. This measures 3.3 x 2 x 2.6 cm. Large amount of complex fluid within the cul-de-sac and adnexa. IMPRESSION: 1. No IUP identified. 3.3 cm complex right adnexal mass with large amount of complex fluid in the pelvis. Findings concerning for ruptured right ectopic pregnancy. Critical Value/emergent results were called by telephone at the time of interpretation on 12/09/2019 at 5:07 pm to provider [facility] , who verbally acknowledged these results. Electronically Signed   By: Jasmine Pang M.D.   On: 12/09/2019 17:07   Assessment and Plan  --24 y.o. G1P0 at [redacted]w[redacted]d  --Ruptured right ectopic, currently stable --Dr. Donavan Foil notified,  inbound to bedside --Dr. Germaine Pomfret notified of impending OR case --Patient declines pain medication throughout period of evaluation in MAU --Patient NPO since last night aside from sips of water --Per Dr. Donavan Foil, prep for OR  Calvert Cantor, CNM 12/09/2019, 7:06 PM

## 2019-12-09 NOTE — Anesthesia Procedure Notes (Signed)
Procedure Name: Intubation Date/Time: 12/09/2019 8:01 PM Performed by: Claris Che, CRNA Pre-anesthesia Checklist: Patient identified, Emergency Drugs available, Suction available, Patient being monitored and Timeout performed Patient Re-evaluated:Patient Re-evaluated prior to induction Oxygen Delivery Method: Circle system utilized Preoxygenation: Pre-oxygenation with 100% oxygen Induction Type: IV induction and Cricoid Pressure applied Ventilation: Mask ventilation without difficulty Laryngoscope Size: Mac and 4 Grade View: Grade II Tube size: 7.5 mm Number of attempts: 1 Airway Equipment and Method: Stylet Placement Confirmation: ETT inserted through vocal cords under direct vision,  positive ETCO2 and breath sounds checked- equal and bilateral Secured at: 23 cm Tube secured with: Tape Dental Injury: Teeth and Oropharynx as per pre-operative assessment

## 2019-12-09 NOTE — Op Note (Signed)
Jamille Ronak Valente PROCEDURE DATE: 12/09/2019  PREOPERATIVE DIAGNOSES: suspected ruptured ectopic pregnancy POSTOPERATIVE DIAGNOSES: The same PROCEDURE: diagnostic laparoscopy, right salpingectomy, evacuation of hemoperitoneum SURGEON:  Mariel Aloe, MD ASSISTANT: n/a ANESTHESIOLOGY TEAM: Anesthesiologist: Marcene Duos, MD CRNA: Melina Schools, CRNA  INDICATIONS: 24 y.o. G1P0 with aforementioned preoperative diagnoses here today for definitive surgical management.   Risks of surgery were discussed with the patient including but not limited to: bleeding which may require transfusion or reoperation; infection which may require antibiotics; injury to bowel, bladder, ureters or other surrounding organs; need for additional procedures including laparotomy; thromboembolic phenomenon, incisional problems and other postoperative/anesthesia complications. Written informed consent was obtained.    FINDINGS:  Significant hemoperitoneum, actively bleeding right ectopic pregnancy  ANESTHESIA:  General anesthesia INTRAVENOUS FLUIDS: 1400 ml Urine output: 200 ml ESTIMATED BLOOD LOSS: 15 ml, 300 ml hemoperitoneum SPECIMENS:  Right tube and ectopic COMPLICATIONS: None immediate   PROCEDURE IN DETAIL:  The patient received intravenous antibiotics and had sequential compression devices applied to her lower extremities while in the preoperative area.  She was then taken to the operating room where general anesthesia was administered and was found to be adequate.  She was placed in the dorsal lithotomy position, and was prepped and draped in a sterile manner.  A Foley catheter was inserted into her bladder and attached to constant drainage. A timeout was performed to verify patient and procedure. A uterine manipulator was then advanced into the uterus. Attention was then turned to the patient's abdomen where a 5-mm skin incision was made in the umbilical fold.      The 5 mm Optiview port was advanced  under visualization with the endoscope until the peritoneum was entered. The introducer was removed and the camera was replaced within the port to confirm intraperitoneum placement. The abdomen was visualized and survey of the abdomen showed atraumatic entry. Survey of the abdomen and pelvis were noted as above. CO2 insufflation was started at this time.   A 10 mm port was placed into the left lower abdomen 2 cm proximal and median from the left ASIS after incision with a scalpel. The sheath was advanced under direct visualization using the laparoscope. The port was advanced under direct visualization.   A 5 mm port was placed in the right lower abdomen 2 cm proximal and medical to the right ASIS after incision with the scalpel. The port was advanced under direct visualization.    The patient was placed into trendelenburg and the bowel was swept out of the pelvis with a blunt grasper.  There was significant hemoperitoneum which was then cleared using the suction irrigator. The uterus was elevated and the right tube was grasped using an atraumatic grasper. The fallopian tube was removed using a 5 mm Ligasure to ligate and transect along the mesosalpinx just inferior to the tube. Hemostasis noted at ligation site. The tube was placed into a endocatch bag and removed from the abdomen. The pelvis was irrigated and suctioned with hemostasis noted at site. The left tube and ovary were inspected and noted to be normal. The abdomen was again inspected and no bleeding or other abnormalities noted. At this point the procedure was completed and the trocars were removed from the abdomen. The pneumoperitoneum was removed as much as possible. The fascia of the left10 mm port was closed using 0-Vicryl. The skin of the remaining  ports was closed using 4-0 Monocryl at the left port site and dermabond at the other two sites. The uterine  manipulator was removed and no bleeding noted at the site of the tenaculum.   The  patient was extubated without difficulty and taken to PACU in stable condition.   The patient will be discharged to home as per PACU criteria.  Routine postoperative instructions given.  She was prescribed Percocet, Ibuprofen.  She will follow up in the clinic in 2 weeks for postoperative evaluation.   Mariel Aloe, MD Faculty Attending, [facility] for [facility]

## 2019-12-09 NOTE — Discharge Instructions (Signed)
Outpatient Surgery, Adult, Care After These instructions provide you with information about caring for yourself after your procedure. Your health care provider may also give you more specific instructions. Your treatment has been planned according to current medical practices, but problems sometimes occur. Call your health care provider if you have any problems or questions after your procedure. What can I expect after the procedure? After the procedure, it is common to have:  Tenderness and numbness at the surgical site.  Swelling and bruising around the surgical site.  Nausea. Follow these instructions at home:     For at least 24 hours after the procedure:  Have a responsible adult stay with you. It is important to have someone help care for you until you are awake and alert.  Rest as needed.  Do not: ? Participate in activities in which you could fall or become injured. ? Drive. ? Use heavy machinery. ? Drink alcohol. ? Take sleeping pills or medicines that cause drowsiness. ? Make important decisions or sign legal documents. ? Take care of children on your own. Activity  Return to your normal activities as told by your health care provider. Ask your health care provider what activities are safe for you.  Do not lift anything that is heavier than 10 lb (4.5 kg), or the limit that your health care provider tells you, until your health care provider says it is okay.  Do not play contact sports until your health care provider says it is okay. Incision care  Follow instructions from your health care provider about how to take care of an incision, if you have one. Make sure you: ? Wash your hands with soap and water before you change your bandage (dressing). If soap and water are not available, use hand sanitizer. ? Change your dressing as told by your health care provider. ? Leave stitches (sutures), skin glue, or adhesive strips in place. These skin closures may need to stay  in place for 2 weeks or longer. If adhesive strip edges start to loosen and curl up, you may trim the loose edges. Do not remove adhesive strips completely unless your health care provider tells you to do that.  Check your incision area every day for signs of infection. Check for: ? More redness, swelling, or pain. ? More fluid or blood. ? Warmth. ? Pus or a bad smell. Medicines  Take over-the-counter and prescription medicines only as told by your health care provider.  Do not drive or use heavy machinery while taking prescription pain medicines. Eating and drinking  Follow the diet recommended by your health care provider.  When you are hungry, begin eating light and bland foods such as toast. Gradually return to your regular diet.  If you vomit: ? Drink water, juice, or soup when you can drink without vomiting. ? Make sure you have little or no nausea before eating solid foods. General instructions  If you have sleep apnea, surgery and certain medicines can increase your risk for breathing problems. Follow instructions from your HCP about wearing your sleep device: ? Anytime you are sleeping, including during daytime naps. ? While taking prescription pain medicines, sleeping medicines, or medicines that make you drowsy.  Do not use any tobacco products, such as cigarettes, chewing tobacco, and e-cigarettes, for as long as possible.  If you smoke, do not smoke without supervision.  Keep all follow-up visits as told by your health care provider. This is important. Contact a health care provider if:  You  have more redness, swelling, or pain around your incision.  You have more fluid or blood coming from your incision.  Your incision feels warm to the touch.  You have pus or a bad smell coming from your incision.  You have a fever.  You feel light-headed or you faint.  You develop a rash.  You keep feeling nauseous or keep vomiting.  You have very bad pain, even after  taking the medicines your health care provider has prescribed or recommended.  You have constipation. Get help right away if:  You are unable to pass urine.  You have trouble breathing. Summary  Have a responsible adult stay with you for at least 24 hours after the procedure.  Nausea is common after a procedure. Make sure you have little or no nausea before eating solid foods. Follow the diet recommended by your health care provider.  Ask your health care provider what activities are safe for you. This information is not intended to replace advice given to you by your health care provider. Make sure you discuss any questions you have with your health care provider. Document Revised: 07/03/2017 Document Reviewed: 07/26/2015 Elsevier Patient Education  2020 [employer]. Ectopic Pregnancy  An ectopic pregnancy happens when a fertilized egg grows outside the womb (uterus). The fertilized egg cannot stay alive outside of the womb. This problem often happens in a fallopian tube. It is often caused by damage to the tube. If this problem is found early, you may be treated with medicine that stops the egg from growing. If your tube tears or bursts open (ruptures), you will bleed inside. Often, there is very bad pain in the lower belly. This is an emergency. You will need surgery. Get help right away. Follow these instructions at home: After being treated with medicine or surgery:  Rest and limit your activity for as long as told by your doctor.  Until your doctor says that it is safe: ? Do not lift anything that is heavier than 10 lb (4.5 kg) or the limit that your doctor tells you. ? Avoid exercise and any movement that takes a lot of effort.  To prevent problems when pooping (constipation): ? Eat a healthy diet. This includes:  Fruits.  Vegetables.  Whole grains. ? Drink 6-8 glasses of water a day. Contact a doctor if: Get help right away if:  You have sudden and very bad pain  in your belly.  You have very bad pain in your shoulders or neck.  You have pain that gets worse and is not helped by medicine.  You have: ? A fever or chills. ? Vaginal bleeding. ? Redness or swelling at the site of a surgical cut (incision).  You feel sick to your stomach (nauseous) or you throw up (vomit).  You feel dizzy or weak.  You feel light-headed or you pass out (faint). Summary  An ectopic pregnancy happens when a fertilized egg grows outside the womb (uterus).  If this problem is found early, you may be treated with medicine that stops the egg from growing.  If your tube tears or bursts open (ruptures), you will need surgery. This is an emergency. Get help right away. This information is not intended to replace advice given to you by your health care provider. Make sure you discuss any questions you have with your health care provider. Document Revised: 03/17/2017 Document Reviewed: 04/28/2016 Elsevier Patient Education  2020 [employer].

## 2019-12-09 NOTE — ED Triage Notes (Signed)
Emergency Medicine Provider OB Triage Evaluation Note  Kathryn Navarro is a 24 y.o. female, No obstetric history on file., at Unknown gestation who presents to the emergency department with complaints of Vaginal Pain.  History obtained with help of patient's husband at bedside.  Patient's language Fran[place] P[place] is unavailable, patient does not speak some [place], she states that she wants her husband to translate for her.  Patient with lower abdominal pain onset yesterday, radiates into her vagina, started around 6 PM.  Pain was mild at first gradually resolved and then returned right before going to bed, pain kept her awake throughout the night.  She denies similar pain in the past.  She denies any other associated symptoms.  Patient had been in the waiting room around 4 hours, she had blood work drawn which showed beta-hCG greater than 2000.  Patient was unaware that she was pregnant prior to arrival.    Review of  Systems  Positive: + Pelvic/Vaginal Pain Negative: Fever/Chills, chest pain, shortness of breath, vaginal bleeding, injury, vomiting, diarrhea  Physical Exam  BP 136/86   Pulse 88   Temp 98.3 F (36.8 C) (Oral)   Resp 15   LMP 11/07/2019   SpO2 100%  General: Awake, no distress talking comfortably with her husband at bedside HEENT: Atraumatic  Resp: Normal effort  Cardiac: Normal rate Abd: Mild lower abd tenderness without focal pain, rebound or guarding. MSK: Moves all extremities without difficulty Neuro: Speech clear  Medical Decision Making  Pt evaluated for pregnancy concern and is stable for transfer to MAU. Pt is in agreement with plan for transfer.  I was asked by nursing staff to evaluate patient's appropriateness for MAU.  Remainder blood work showed possible UTI however squamous cells are present.  CMP showed no emergent electrolyte derangement, AKI, LFT elevation or gap.  CBC showed leukocytosis of 14.0, mild anemia of 10.7.  Given patient's lower abdominal  pain and positive pregnancy test concern patient symptoms are pregnancy related will transfer to MAU for further evaluation. Discussed with Dr. Clarene Duke who agrees with plan.  1:14 PM Discussed with MAU APP, Terri, who accepts patient in transfer. Greg RN aware of need to transfer to MAU.  Patient and her husband agreeable for transfer.  Clinical Impression   1. Positive pregnancy test   2. Lower abdominal pain        Elizabeth Palau 12/09/19 1323

## 2019-12-09 NOTE — MAU Note (Signed)
Sent up from ED 

## 2019-12-09 NOTE — H&P (Signed)
Faculty Practice Obstetrics and Gynecology Attending History and Physical  Kathryn Navarro is a 24 y.o. G1P0 at [redacted]w[redacted]d who presented to MAU today for evaluation of severe abdominal pain starting on 12/08/2019.  Pain has been constant with irregular exacerbations.  Pain became worse this AM.  She denies nausea and vomiting.    The pain is described as intense cramping.  She currently complains of bilateral lower quadrant pain.  She does have a recent diagnosis of PCOS.  Pt and spouse deny history of gonorrhea/chlamydia.  Pregnancy test was positive, and TVUS showed no IUP along with significant free fluid suggestive of ruptured ectopic pregnancy.   Past Medical History:  Diagnosis Date  . PCOS (polycystic ovarian syndrome)    History reviewed. No pertinent surgical history. OB History  Gravida Para Term Preterm AB Living  1            SAB TAB Ectopic Multiple Live Births               # Outcome Date GA Lbr Len/2nd Weight Sex Delivery Anes PTL Lv  1 Current           Patient denies any other pertinent gynecologic issues.  No current facility-administered medications on file prior to encounter.   Current Outpatient Medications on File Prior to Encounter  Medication Sig Dispense Refill  . folic acid (FOLVITE) 1 MG tablet Take 1 tablet (1 mg total) by mouth daily. 90 tablet 3   No Known Allergies  Social History:   reports that she has never smoked. She has never used smokeless tobacco. She reports previous alcohol use. She reports that she does not use drugs. History reviewed. No pertinent family history.   History reviewed. No pertinent surgical history.  Review of Systems: Pertinent items noted in HPI and remainder of comprehensive ROS otherwise negative.  PHYSICAL EXAM: Blood pressure 125/75, pulse 90, temperature 98.5 F (36.9 C), temperature source Oral, resp. rate 17, height 5\' 4"  (1.626 m), weight 78.5 kg, last menstrual period 11/07/2019, SpO2 100 %. CONSTITUTIONAL:  Well-developed, well-nourished female in no acute distress.  HENT:  Normocephalic, atraumatic, External right and left ear normal. Oropharynx is clear and moist SKIN: Skin is warm and dry. No rash noted. Not diaphoretic. No erythema. No pallor. NEUROLOGIC: Alert and oriented to person, place, and time. Normal reflexes, muscle tone coordination. No cranial nerve deficit noted. CARDIOVASCULAR: Normal heart rate noted, regular rhythm RESPIRATORY: Effort and breath sounds normal, no problems with respiration noted ABDOMEN: Soft, diffusely tender, worse in the midline and LLQ. nondistended. PELVIC: deferred MUSCULOSKELETAL: Normal range of motion. No tenderness.  No cyanosis, clubbing, or edema.  2+ distal pulses.  Labs: Results for orders placed or performed during the hospital encounter of 12/09/19 (from the past 336 hour(s))  Lipase, blood   Collection Time: 12/09/19  8:58 AM  Result Value Ref Range   Lipase 26 11 - 51 U/L  Comprehensive metabolic panel   Collection Time: 12/09/19  8:58 AM  Result Value Ref Range   Sodium 139 135 - 145 mmol/L   Potassium 3.9 3.5 - 5.1 mmol/L   Chloride 109 98 - 111 mmol/L   CO2 21 (L) 22 - 32 mmol/L   Glucose, Bld 123 (H) 70 - 99 mg/dL   BUN 5 (L) 6 - 20 mg/dL   Creatinine, Ser 12/11/19 0.44 - 1.00 mg/dL   Calcium 9.0 8.9 - 4.25 mg/dL   Total Protein 6.8 6.5 - 8.1 g/dL   Albumin 3.8  3.5 - 5.0 g/dL   AST 18 15 - 41 U/L   ALT 18 0 - 44 U/L   Alkaline Phosphatase 42 38 - 126 U/L   Total Bilirubin 0.7 0.3 - 1.2 mg/dL   GFR calc non Af Amer >60 >60 mL/min   GFR calc Af Amer >60 >60 mL/min   Anion gap 9 5 - 15  CBC   Collection Time: 12/09/19  8:58 AM  Result Value Ref Range   WBC 14.0 (H) 4.0 - 10.5 K/uL   RBC 3.87 3.87 - 5.11 MIL/uL   Hemoglobin 10.7 (L) 12.0 - 15.0 g/dL   HCT 30.0 (L) 36 - 46 %   MCV 84.8 80.0 - 100.0 fL   MCH 27.6 26.0 - 34.0 pg   MCHC 32.6 30.0 - 36.0 g/dL   RDW 92.3 30.0 - 76.2 %   Platelets 356 150 - 400 K/uL   nRBC 0.0 0.0  - 0.2 %  Urinalysis, Routine w reflex microscopic   Collection Time: 12/09/19  8:58 AM  Result Value Ref Range   Color, Urine YELLOW YELLOW   APPearance TURBID (A) CLEAR   Specific Gravity, Urine 1.025 1.005 - 1.030   pH 7.0 5.0 - 8.0   Glucose, UA NEGATIVE NEGATIVE mg/dL   Hgb urine dipstick SMALL (A) NEGATIVE   Bilirubin Urine NEGATIVE NEGATIVE   Ketones, ur NEGATIVE NEGATIVE mg/dL   Protein, ur 30 (A) NEGATIVE mg/dL   Nitrite NEGATIVE NEGATIVE   Leukocytes,Ua LARGE (A) NEGATIVE  Urinalysis, Microscopic (reflex)   Collection Time: 12/09/19  8:58 AM  Result Value Ref Range   RBC / HPF 0-5 0 - 5 RBC/hpf   WBC, UA >50 0 - 5 WBC/hpf   Bacteria, UA MANY (A) NONE SEEN   Squamous Epithelial / LPF 11-20 0 - 5   Non Squamous Epithelial PRESENT (A) NONE SEEN   Urine-Other MICROSCOPIC EXAM PERFORMED ON UNCONCENTRATED URINE   I-Stat beta hCG blood, ED   Collection Time: 12/09/19  9:15 AM  Result Value Ref Range   I-stat hCG, quantitative >2,000.0 (H) <5 mIU/mL   Comment 3          Wet prep, genital   Collection Time: 12/09/19  2:36 PM  Result Value Ref Range   Yeast Wet Prep HPF POC NONE SEEN NONE SEEN   Trich, Wet Prep NONE SEEN NONE SEEN   Clue Cells Wet Prep HPF POC NONE SEEN NONE SEEN   WBC, Wet Prep HPF POC MANY (A) NONE SEEN   Sperm NONE SEEN   hCG, quantitative, pregnancy   Collection Time: 12/09/19  3:06 PM  Result Value Ref Range   hCG, Beta Chain, Quant, S 18,318 (H) <5 mIU/mL  ABO/Rh   Collection Time: 12/09/19  3:06 PM  Result Value Ref Range   ABO/RH(D) B POS    No rh immune globuloin      NOT A RH IMMUNE GLOBULIN CANDIDATE, PT RH POSITIVE Performed at [facility], [address], [place], [place] [address]   Type and screen   Collection Time: 12/09/19  5:33 PM  Result Value Ref Range   ABO/RH(D) PENDING    Antibody Screen PENDING    Sample Expiration      12/12/2019,2359 Performed at [facility], [address], [place], [place] [address]      Imaging Studies: US OB LESS THAN 14 WEEKS WITH OB TRANSVAGINAL  Result Date: 12/09/2019 CLINICAL DATA:  Pelvic pain EXAM: OBSTETRIC <14 WK  [place] AND TRANSVAGINAL OB US TECHNIQUE: Both transabdominal and transvaginal ultrasound examinations were performed for complete evaluation of the gestation as well as the maternal uterus, adnexal regions, and pelvic cul-de-sac. Transvaginal technique was performed to assess early pregnancy. COMPARISON:  None. FINDINGS: Intrauterine gestational sac: None Yolk sac:  Not Visualized. Embryo:  Not Visualized. Maternal uterus/adnexae: Ovaries are within normal limits. Right ovary measures 2.5 by 3.7 x 3.6 cm. The left ovary measures 3.1 by 3 x 1.9 cm. Heterogenous right adnexal mass with echogenic rim, hypoechoic center and peripheral vascularity. This measures 3.3 x 2 x 2.6 cm. Large amount of complex fluid within the cul-de-sac and adnexa. IMPRESSION: 1. No IUP identified. 3.3 cm complex right adnexal mass with large amount of complex fluid in the pelvis. Findings concerning for ruptured right ectopic pregnancy. Critical Value/emergent results were called by telephone at the time of interpretation on 12/09/2019 at 5:07 pm to provider [facility] , who verbally acknowledged these results. Electronically Signed   By: Jasmine Pang M.D.   On: 12/09/2019 17:07    Assessment: Active Problems:   Ectopic pregnancy without intrauterine pregnancy   Plan: Operative laparoscopy with salpingectomy of right ectopic pregnancy 24 y.o. G1P0 with right ruptured ectopic pregnancy on ultrasound.   On exam, she had stable vital signs. Patient was counseled regarding need for diagnostic laparoscopy and probable salpingectomy. Risks of surgery including bleeding which may require transfusion or reoperation, infection, injury to bowel or other surrounding organs, need for additional procedures including (laparoscopy or) laparotomy were explained to patient and written informed consent  was obtained.  Patient has been NPO since the previous evening and she will remain NPO for procedure. Anesthesia and OR aware.  Preoperative prophylactic antibiotics and SCDs ordered on call to the OR.  To OR when ready.  Mariel Aloe, MD, FACOG Obstetrician & Gynecologist, [facility] for [facility], [facility]

## 2019-12-09 NOTE — Transfer of Care (Signed)
Immediate Anesthesia Transfer of Care Note  Patient: Kathryn Navarro  Procedure(s) Performed: DIAGNOSTIC LAPAROSCOPY WITH REMOVAL OF ECTOPIC PREGNANCY (N/A ) LAPAROSCOPIC RIGHT SALPINGECTOMY (Right )  Patient Location: PACU  Anesthesia Type:General  Level of Consciousness: oriented, sedated, drowsy, patient cooperative and responds to stimulation  Airway & Oxygen Therapy: Patient Spontanous Breathing and Patient connected to nasal cannula oxygen  Post-op Assessment: Report given to RN, Post -op Vital signs reviewed and stable and Patient moving all extremities X 4  Post vital signs: Reviewed and stable  Last Vitals:  Vitals Value Taken Time  BP 140/91 12/09/19 2130  Temp 36.6 C 12/09/19 2130  Pulse 97 12/09/19 2132  Resp 14 12/09/19 2132  SpO2 99 % 12/09/19 2132  Vitals shown include unvalidated device data.  Last Pain:  Vitals:   12/09/19 1426  TempSrc: Oral  PainSc:          Complications: No complications documented.

## 2019-12-09 NOTE — Anesthesia Preprocedure Evaluation (Signed)
Anesthesia Evaluation  Patient identified by MRN, date of birth, ID band Patient awake    Reviewed: Allergy & Precautions, NPO status , Patient's Chart, lab work & pertinent test results  Airway Mallampati: II  TM Distance: >3 FB Neck ROM: Full    Dental  (+) Dental Advisory Given   Pulmonary neg pulmonary ROS,    breath sounds clear to auscultation       Cardiovascular negative cardio ROS   Rhythm:Regular Rate:Normal     Neuro/Psych negative neurological ROS     GI/Hepatic negative GI ROS, Neg liver ROS,   Endo/Other  negative endocrine ROS  Renal/GU negative Renal ROS     Musculoskeletal   Abdominal   Peds  Hematology  (+) anemia ,   Anesthesia Other Findings   Reproductive/Obstetrics Ectopic pregnancy                             Anesthesia Physical Anesthesia Plan  ASA: II and emergent  Anesthesia Plan: General   Post-op Pain Management:    Induction: Intravenous  PONV Risk Score and Plan: 4 or greater and Dexamethasone, Treatment may vary due to age or medical condition, Ondansetron, Scopolamine patch - Pre-op and Midazolam  Airway Management Planned: Oral ETT  Additional Equipment: None  Intra-op Plan:   Post-operative Plan: Extubation in OR  Informed Consent: I have reviewed the patients History and Physical, chart, labs and discussed the procedure including the risks, benefits and alternatives for the proposed anesthesia with the patient or authorized representative who has indicated his/her understanding and acceptance.     Dental advisory given  Plan Discussed with: CRNA  Anesthesia Plan Comments:         Anesthesia Quick Evaluation

## 2019-12-10 ENCOUNTER — Encounter (HOSPITAL_COMMUNITY): Payer: Self-pay | Admitting: Obstetrics and Gynecology

## 2019-12-10 ENCOUNTER — Other Ambulatory Visit: Payer: 59

## 2019-12-10 LAB — GC/CHLAMYDIA PROBE AMP (~~LOC~~) NOT AT ARMC
Chlamydia: NEGATIVE
Comment: NEGATIVE
Comment: NORMAL
Neisseria Gonorrhea: NEGATIVE

## 2019-12-11 LAB — SURGICAL PATHOLOGY

## 2019-12-11 NOTE — Anesthesia Postprocedure Evaluation (Signed)
Anesthesia Post Note  Patient: Kathryn Navarro  Procedure(s) Performed: DIAGNOSTIC LAPAROSCOPY WITH REMOVAL OF ECTOPIC PREGNANCY (N/A ) LAPAROSCOPIC RIGHT SALPINGECTOMY (Right )     Patient location during evaluation: PACU Anesthesia Type: General Level of consciousness: awake and alert Pain management: pain level controlled Vital Signs Assessment: post-procedure vital signs reviewed and stable Respiratory status: spontaneous breathing, nonlabored ventilation, respiratory function stable and patient connected to nasal cannula oxygen Cardiovascular status: blood pressure returned to baseline and stable Postop Assessment: no apparent nausea or vomiting Anesthetic complications: no   No complications documented.  Last Vitals:  Vitals:   12/09/19 2159 12/09/19 2208  BP: 134/80   Pulse: 99 (!) 108  Resp: 16 18  Temp:  36.9 C  SpO2: 98% 99%    Last Pain:  Vitals:   12/09/19 2208  TempSrc:   PainSc: 2                  [name]

## 2019-12-24 ENCOUNTER — Encounter: Payer: Self-pay | Admitting: Family Medicine

## 2019-12-24 ENCOUNTER — Other Ambulatory Visit: Payer: Self-pay

## 2019-12-24 ENCOUNTER — Ambulatory Visit (INDEPENDENT_AMBULATORY_CARE_PROVIDER_SITE_OTHER): Payer: 59 | Admitting: Family Medicine

## 2019-12-24 VITALS — BP 112/60 | HR 92 | Ht 64.0 in | Wt 169.1 lb

## 2019-12-24 DIAGNOSIS — O00101 Right tubal pregnancy without intrauterine pregnancy: Secondary | ICD-10-CM

## 2019-12-24 NOTE — Progress Notes (Signed)
    SUBJECTIVE:   CHIEF COMPLAINT / HPI: follow up post ruptured ectopic pregnancy  Husband interprets at patients request   Patient feeling well. Has an OBGYN appointment Sept 09 for follow up.  No abdominal pain or vaginal discharge. Reports an 8 days of menstruation s/p procedure that was unusual for her.    PERTINENT  PMH / PSH:  PCOS  OBJECTIVE:   BP 112/60   Pulse 92   Ht 5\' 4"  (1.626 m)   Wt 169 lb 2 oz (76.7 kg)   LMP 12/09/2019   SpO2 99%   Breastfeeding Unknown   BMI 29.03 kg/m    General: Alert and oriented, no apparent distress  Cardiovascular: RRR with no murmurs noted Respiratory: CTA bilaterally  Gastrointestinal: Bowel sounds present. No abdominal pain, 2 lap surgical wounds healing Psych: Behavior and speech appropriate to situation  ASSESSMENT/PLAN:   Ectopic pregnancy without intrauterine pregnancy Patient feeling well.  Her last Prolactin level was elevated prior to pregnancy and an MRI was ordered.  Given that she had been able to conceive we will hold off on MRI now and repeat Prolactin levels in 1 year. -Follow up with OBGYN as scheduled -Follow up with PCP as needed     12/11/2019, MD [facility] Medicine [facility]

## 2019-12-24 NOTE — Patient Instructions (Signed)
Thank you for coming to see me today. It was a pleasure.  We will hold off on the MRI and repeat Prolactin level in a year.  Follow up with your Gynecologist as scheduled.    Please follow-up with PCP as needed  If you have any questions or concerns, please do not hesitate to call the office at [phone].  Best,   Dana Allan, MD Family Medicine Residency

## 2019-12-26 ENCOUNTER — Ambulatory Visit (INDEPENDENT_AMBULATORY_CARE_PROVIDER_SITE_OTHER): Payer: 59 | Admitting: Obstetrics and Gynecology

## 2019-12-26 ENCOUNTER — Encounter: Payer: Self-pay | Admitting: Obstetrics and Gynecology

## 2019-12-26 ENCOUNTER — Other Ambulatory Visit: Payer: Self-pay

## 2019-12-26 DIAGNOSIS — Z4889 Encounter for other specified surgical aftercare: Secondary | ICD-10-CM | POA: Insufficient documentation

## 2019-12-26 NOTE — Progress Notes (Signed)
    Subjective:    Kathryn Navarro is a 24 y.o. female who presents to the clinic status post diagnostic laparoscopy and right salpingectomy on 12/09/2019. The patient is not having any pain.  Eating a regular diet without difficulty. Bowel movements are normal. No other significant postoperative concerns.  The following portions of the patient's history were reviewed and updated as appropriate: allergies, current medications, past family history, past medical history, past social history, past surgical history and problem list..  Last pap smear was negative on 11/06/19.  Review of Systems Pertinent items are noted in HPI.   Objective:   BP 116/78   Pulse 78   Ht 5\' 4"  (1.626 m)   Wt 164 lb (74.4 kg)   LMP 12/10/2019 (Exact Date)   BMI 28.15 kg/m  Constitutional:  Well-developed, well-nourished female in no acute distress.   Skin: Skin is warm and dry, no rash noted, not diaphoretic,no erythema, no pallor.  Cardiovascular: Normal heart rate noted  Respiratory: Effort and breath sounds normal, no problems with respiration noted  Abdomen: Soft, bowel sounds active, non-tender, no abnormal masses  Incision: Healing well, no drainage, no erythema, no hernia, no seroma, no swelling, no dehiscence, incision well approximated  Pelvic:   Deferred   Surgical pathology  Fallopian tube with right ectopic pregnancy and chorionic villi Assessment:   Doing well postoperatively.  Operative findings again reviewed. Pathology report discussed.   Plan:   1. Continue any current medications. 2. Wound care discussed. 3. Activity restrictions: none 4. Anticipated return to work: now. 5. Follow up as needed 6.  Routine preventative health maintenance measures emphasized. Please refer to After Visit Summary for other counseling recommendations.    12/12/2019, MD, FACOG Attending Obstetrician & Gynecologist Center for [facility]

## 2019-12-29 ENCOUNTER — Encounter: Payer: Self-pay | Admitting: Family Medicine

## 2019-12-29 NOTE — Assessment & Plan Note (Signed)
Patient feeling well.  Her last Prolactin level was elevated prior to pregnancy and an MRI was ordered.  Given that she had been able to conceive we will hold off on MRI now and repeat Prolactin levels in 1 year. -Follow up with OBGYN as scheduled -Follow up with PCP as needed

## 2020-03-04 NOTE — Progress Notes (Signed)
    SUBJECTIVE:   CHIEF COMPLAINT / HPI: intermittent abdominal pain for 2 days   History obtained by husband as interpreter with Kathryn Navarro permission.   Kathryn Navarro is a 24 y.o. female who presents for evaluation of abdominal pain. Onset was 2 day ago. Symptoms have been completely resolved. The pain is described as intermittent colicky. Pain is located in the suprapubic region without radiation.  Aggravating factors: none.  Alleviating factors: none. Associated symptoms: none. The patient denies anorexia, belching, chills, constipation, diarrhea, dysuria, fever, flatus, hematochezia, hematuria, nausea and vomiting.  Given recent history of ectopic, husband was concerned and wanted Kathryn Navarro to visit to be checked out to be sure things were ok.  PERTINENT  PMH / PSH: Rt salpingectomy for ectopic pregnancy  PCOS OBJECTIVE:   BP 121/80   Pulse 91   Ht 5' 4.57" (1.64 m)   Wt 173 lb (78.5 kg)   LMP 02/17/2020 (Exact Date)   SpO2 100%   BMI 29.18 kg/m    General: Alert and oriented, no apparent distress  Gastrointestinal: Bowel sounds present. Mild suprapubic abdominal pain on deep palpation. No rebound tenderness. Negative Carnetts test  ASSESSMENT/PLAN:   Abdominal pain in female Pain has resolved.  Lasted 2 days.  Differentials included UTI, ectopic pregnancy, ovarian torsion and Anterior Cutaneous Nerve Entrapment. Abdominal exam positive for mild suprapubic tenderness. -UPT negative -Urinalysis shows small amt leukocytes -urine for culture -Discussed with patient that if urine culture results positive will send in script and treat. Given that not currently not having pain and no dysuria patient not wanting to start antibiotics if not necessary. -Strict return precautions provided -Follow up as needed     Dana Allan, MD [facility]

## 2020-03-04 NOTE — Patient Instructions (Signed)
Thank you for coming to see me today. It was a pleasure.    I have sent a urine culture and will call you with the results.  If positive I will send a prescription to the pharmacy and let you know.  Your pregnancy test was negative  If your symptoms worsen please let me know or go to the Emergency department. Please follow-up with me as needed  If you have any questions or concerns, please do not hesitate to call the office at [phone].  Best,   Dana Allan, MD Family Medicine Residency    Abdominal Pain, Adult Many things can cause belly (abdominal) pain. Most times, belly pain is not dangerous. Many cases of belly pain can be watched and treated at home. Sometimes, though, belly pain is serious. Your doctor will try to find the cause of your belly pain. Follow these instructions at home:  Medicines  Take over-the-counter and prescription medicines only as told by your doctor.  Do not take medicines that help you poop (laxatives) unless told by your doctor. General instructions  Watch your belly pain for any changes.  Drink enough fluid to keep your pee (urine) pale yellow.  Keep all follow-up visits as told by your doctor. This is important. Contact a doctor if:  Your belly pain changes or gets worse.  You are not hungry, or you lose weight without trying.  You are having trouble pooping (constipated) or have watery poop (diarrhea) for more than 2-3 days.  You have pain when you pee or poop.  Your belly pain wakes you up at night.  Your pain gets worse with meals, after eating, or with certain foods.  You are vomiting and cannot keep anything down.  You have a fever.  You have blood in your pee. Get help right away if:  Your pain does not go away as soon as your doctor says it should.  You cannot stop vomiting.  Your pain is only in areas of your belly, such as the right side or the left lower part of the belly.  You have bloody or black poop, or  poop that looks like tar.  You have very bad pain, cramping, or bloating in your belly.  You have signs of not having enough fluid or water in your body (dehydration), such as: ? Dark pee, very little pee, or no pee. ? Cracked lips. ? Dry mouth. ? Sunken eyes. ? Sleepiness. ? Weakness.  You have trouble breathing or chest pain. Summary  Many cases of belly pain can be watched and treated at home.  Watch your belly pain for any changes.  Take over-the-counter and prescription medicines only as told by your doctor.  Contact a doctor if your belly pain changes or gets worse.  Get help right away if you have very bad pain, cramping, or bloating in your belly. This information is not intended to replace advice given to you by your health care provider. Make sure you discuss any questions you have with your health care provider. Document Revised: 08/13/2018 Document Reviewed: 08/13/2018 Elsevier Patient Education  2020 [employer].

## 2020-03-10 ENCOUNTER — Ambulatory Visit: Payer: 59 | Admitting: Family Medicine

## 2020-03-10 ENCOUNTER — Other Ambulatory Visit: Payer: Self-pay

## 2020-03-10 ENCOUNTER — Encounter: Payer: Self-pay | Admitting: Family Medicine

## 2020-03-10 VITALS — BP 121/80 | HR 91 | Ht 64.57 in | Wt 173.0 lb

## 2020-03-10 DIAGNOSIS — R1084 Generalized abdominal pain: Secondary | ICD-10-CM | POA: Diagnosis not present

## 2020-03-10 DIAGNOSIS — R109 Unspecified abdominal pain: Secondary | ICD-10-CM | POA: Diagnosis not present

## 2020-03-10 LAB — POCT URINALYSIS DIP (MANUAL ENTRY)
Bilirubin, UA: NEGATIVE
Blood, UA: NEGATIVE
Glucose, UA: NEGATIVE mg/dL
Ketones, POC UA: NEGATIVE mg/dL
Nitrite, UA: NEGATIVE
Protein Ur, POC: NEGATIVE mg/dL
Spec Grav, UA: 1.025 (ref 1.010–1.025)
Urobilinogen, UA: 0.2 E.U./dL
pH, UA: 7.5 (ref 5.0–8.0)

## 2020-03-10 LAB — POCT UA - MICROSCOPIC ONLY

## 2020-03-10 LAB — POCT URINE PREGNANCY: Preg Test, Ur: NEGATIVE

## 2020-03-12 ENCOUNTER — Encounter: Payer: Self-pay | Admitting: Family Medicine

## 2020-03-12 DIAGNOSIS — R109 Unspecified abdominal pain: Secondary | ICD-10-CM | POA: Insufficient documentation

## 2020-03-12 NOTE — Assessment & Plan Note (Signed)
Pain has resolved.  Lasted 2 days.  Differentials included UTI, ectopic pregnancy, ovarian torsion and Anterior Cutaneous Nerve Entrapment. Abdominal exam positive for mild suprapubic tenderness. -UPT negative -Urinalysis shows small amt leukocytes -urine for culture -Discussed with patient that if urine culture results positive will send in script and treat. Given that not currently not having pain and no dysuria patient not wanting to start antibiotics if not necessary. -Strict return precautions provided -Follow up as needed

## 2020-08-23 ENCOUNTER — Encounter (HOSPITAL_COMMUNITY): Payer: Self-pay | Admitting: Obstetrics

## 2020-08-23 ENCOUNTER — Other Ambulatory Visit: Payer: Self-pay

## 2020-08-23 ENCOUNTER — Inpatient Hospital Stay (HOSPITAL_COMMUNITY): Payer: 59

## 2020-08-23 ENCOUNTER — Inpatient Hospital Stay (HOSPITAL_COMMUNITY)
Admission: AD | Admit: 2020-08-23 | Discharge: 2020-08-23 | Disposition: A | Discharge status: Home / Self Care | Payer: 59 | Attending: Obstetrics | Admitting: Obstetrics

## 2020-08-23 DIAGNOSIS — O26891 Other specified pregnancy related conditions, first trimester: Secondary | ICD-10-CM | POA: Diagnosis not present

## 2020-08-23 DIAGNOSIS — O26899 Other specified pregnancy related conditions, unspecified trimester: Secondary | ICD-10-CM

## 2020-08-23 DIAGNOSIS — R109 Unspecified abdominal pain: Secondary | ICD-10-CM | POA: Insufficient documentation

## 2020-08-23 DIAGNOSIS — O3680X0 Pregnancy with inconclusive fetal viability, not applicable or unspecified: Secondary | ICD-10-CM | POA: Insufficient documentation

## 2020-08-23 DIAGNOSIS — Z3A01 Less than 8 weeks gestation of pregnancy: Secondary | ICD-10-CM | POA: Diagnosis not present

## 2020-08-23 LAB — CBC
HCT: 37.8 % (ref 36.0–46.0)
Hemoglobin: 12.5 g/dL (ref 12.0–15.0)
MCH: 26.1 pg (ref 26.0–34.0)
MCHC: 33.1 g/dL (ref 30.0–36.0)
MCV: 78.9 fL — ABNORMAL LOW (ref 80.0–100.0)
Platelets: 431 10*3/uL — ABNORMAL HIGH (ref 150–400)
RBC: 4.79 MIL/uL (ref 3.87–5.11)
RDW: 13.6 % (ref 11.5–15.5)
WBC: 10.4 10*3/uL (ref 4.0–10.5)
nRBC: 0 % (ref 0.0–0.2)

## 2020-08-23 LAB — HCG, QUANTITATIVE, PREGNANCY: hCG, Beta Chain, Quant, S: 44 m[IU]/mL — ABNORMAL HIGH (ref <5)

## 2020-08-23 LAB — POCT PREGNANCY, URINE: Preg Test, Ur: POSITIVE — AB

## 2020-08-23 NOTE — Discharge Instructions (Signed)
Safe Medications in Pregnancy   Acne: Benzoyl Peroxide Salicylic Acid  Backache/Headache: Tylenol: 2 regular strength every 4 hours OR              2 Extra strength every 6 hours  Colds/Coughs/Allergies: Benadryl (alcohol free) 25 mg every 6 hours as needed Breath right strips Claritin Cepacol throat lozenges Chloraseptic throat spray Cold-Eeze- up to three times per day Cough drops, alcohol free Flonase (by prescription only) Guaifenesin Mucinex Robitussin DM (plain only, alcohol free) Saline nasal spray/drops Sudafed (pseudoephedrine) & Actifed ** use only after [redacted] weeks gestation and if you do not have high blood pressure Tylenol Vicks Vaporub Zinc lozenges Zyrtec   Constipation: Colace Ducolax suppositories Fleet enema Glycerin suppositories Metamucil Milk of magnesia Miralax Senokot Smooth move tea  Diarrhea: Kaopectate Imodium A-D  *NO pepto Bismol  Hemorrhoids: Anusol Anusol HC Preparation H Tucks  Indigestion: Tums Maalox Mylanta Zantac  Pepcid  Insomnia: Benadryl (alcohol free) 25mg  every 6 hours as needed Tylenol PM Unisom, no Gelcaps  Leg Cramps: Tums MagGel  Nausea/Vomiting:  Bonine Dramamine Emetrol Ginger extract Sea bands Meclizine  Nausea medication to take during pregnancy:  Unisom (doxylamine succinate 25 mg tablets) Take one tablet daily at bedtime. If symptoms are not adequately controlled, the dose can be increased to a maximum recommended dose of two tablets daily (1/2 tablet in the morning, 1/2 tablet mid-afternoon and one at bedtime). Vitamin B6 100mg  tablets. Take one tablet twice a day (up to 200 mg per day).  Skin Rashes: Aveeno products Benadryl cream or 25mg  every 6 hours as needed Calamine Lotion 1% cortisone cream  Yeast infection: Gyne-lotrimin 7 Monistat 7   **If taking multiple medications, please check labels to avoid duplicating the same active ingredients **take medication as directed on  the label ** Do not exceed 4000 mg of tylenol in 24 hours **Do not take medications that contain aspirin or ibuprofen      Abdominal Pain During Pregnancy Abdominal pain is common during pregnancy and has many possible causes. Some causes are more serious than others, and sometimes the cause is not known. Abdominal pain can be a sign that labor is starting. It can also be caused by normal growth of your baby causing stretching of muscles and ligaments during pregnancy. Always tell your health care provider if you have any abdominal pain. Follow these instructions at home:  Do not have sex or put anything in your vagina until your pain goes away completely.  Get plenty of rest until your pain improves.  Drink enough fluid to keep your urine pale yellow.  Take over-the-counter and prescription medicines only as told by your health care provider.  Keep all follow-up visits. This is important.   Contact a health care provider if:  Your pain continues or gets worse after resting.  You have lower abdominal pain that: ? Comes and goes at regular intervals. ? Spreads to your back. ? Is similar to menstrual cramps.  You have pain or burning when you urinate. Get help right away if:  You have a fever, chills, or shortness of breath.  You have vaginal bleeding.  You are leaking fluid or passing tissue from your vagina.  You have vomiting or diarrhea that lasts for more than 24 hours.  Your baby is moving less than usual.  You feel very weak or faint.  You develop severe pain in your upper abdomen. Summary  Abdominal pain is common during pregnancy and has many possible causes.  If  you experience abdominal pain during pregnancy, tell your health care provider right away.  Follow your health care provider's home care instructions and keep all follow-up visits as told. This information is not intended to replace advice given to you by your health care provider. Make sure you  discuss any questions you have with your health care provider. Document Revised: 12/17/2019 Document Reviewed: 12/17/2019 Elsevier Patient Education  2021 [employer].

## 2020-08-23 NOTE — MAU Note (Signed)
Pt reports to mau with c/o lower back and abd pain that started Friday.  Denies bleeding.  Reports she has started care at [facility] with Dr. Juliene Pina

## 2020-08-23 NOTE — MAU Provider Note (Signed)
History     CSN: [id number]  Arrival date and time: 08/23/20 1411   Event Date/Time   First Provider Initiated Contact with Patient 08/23/20 1459     Chief Complaint  Patient presents with  . Abdominal Pain  . Back Pain   HPI Kathryn Navarro is a 25 y.o. G2P0010 at [redacted]w[redacted]d who presents with lower abdominal cramping. She states she had a faint positive pregnancy test on Friday and went to [facility] for bloodwork. She states they confirmed she is pregnant but did not tell her any number results of the lab work. She states today she started having lower abdominal cramping that she rates a 3/10 and has not tried anything for the pain. She denies bleeding or discharge. She is concerned because she had a ruptured right ectopic pregnancy last year and had similar pain.   OB History    Gravida  2   Para      Term      Preterm      AB  1   Living        SAB      IAB      Ectopic  1   Multiple      Live Births              Past Medical History:  Diagnosis Date  . PCOS (polycystic ovarian syndrome)     Past Surgical History:  Procedure Laterality Date  . DIAGNOSTIC LAPAROSCOPY WITH REMOVAL OF ECTOPIC PREGNANCY N/A 12/09/2019   Procedure: DIAGNOSTIC LAPAROSCOPY WITH REMOVAL OF ECTOPIC PREGNANCY;  Surgeon: Warden Fillers, MD;  Location: [facility] OR;  Service: Gynecology;  Laterality: N/A;  . LAPAROSCOPIC UNILATERAL SALPINGECTOMY Right 12/09/2019   Procedure: LAPAROSCOPIC RIGHT SALPINGECTOMY;  Surgeon: Warden Fillers, MD;  Location: [facility] OR;  Service: Gynecology;  Laterality: Right;    No family history on file.  Social History   Tobacco Use  . Smoking status: Never Smoker  . Smokeless tobacco: Never Used  Substance Use Topics  . Alcohol use: Not Currently  . Drug use: Never    Allergies: No Known Allergies  Medications Prior to Admission  Medication Sig Dispense Refill Last Dose  . acetaminophen (TYLENOL) 500 MG tablet Take 1,000 mg by mouth every 6 (six)  hours as needed (pain).     . folic acid (FOLVITE) 1 MG tablet Take 1 tablet (1 mg total) by mouth daily. 90 tablet 3   . ibuprofen (ADVIL) 600 MG tablet Take 1 tablet (600 mg total) by mouth every 6 (six) hours as needed for headache, mild pain, moderate pain or cramping. 30 tablet 1     Review of Systems  Constitutional: Negative.  Negative for fatigue and fever.  HENT: Negative.   Respiratory: Negative.  Negative for shortness of breath.   Cardiovascular: Negative.  Negative for chest pain.  Gastrointestinal: Positive for abdominal pain. Negative for constipation, diarrhea, nausea and vomiting.  Genitourinary: Negative.  Negative for dysuria.  Neurological: Negative.  Negative for dizziness and headaches.   Physical Exam   Blood pressure 131/88, pulse (!) 110, temperature 98.2 F (36.8 C), temperature source Oral, resp. rate 18, last menstrual period 07/20/2020, SpO2 100 %, unknown if currently breastfeeding.  Physical Exam Vitals and nursing note reviewed.  Constitutional:      General: She is not in acute distress.    Appearance: She is well-developed.  HENT:     Head: Normocephalic.  Eyes:     Pupils: Pupils are  equal, round, and reactive to light.  Cardiovascular:     Rate and Rhythm: Normal rate and regular rhythm.     Heart sounds: Normal heart sounds.  Pulmonary:     Effort: Pulmonary effort is normal. No respiratory distress.     Breath sounds: Normal breath sounds.  Abdominal:     General: Bowel sounds are normal. There is no distension.     Palpations: Abdomen is soft.     Tenderness: There is no abdominal tenderness.  Skin:    General: Skin is warm and dry.  Neurological:     Mental Status: She is alert and oriented to person, place, and time.  Psychiatric:        Behavior: Behavior normal.        Thought Content: Thought content normal.        Judgment: Judgment normal.     MAU Course  Procedures Results for orders placed or performed during the  hospital encounter of 08/23/20 (from the past 24 hour(s))  Pregnancy, urine POC     Status: Abnormal   Collection Time: 08/23/20  2:29 PM  Result Value Ref Range   Preg Test, Ur POSITIVE (A) NEGATIVE  CBC     Status: Abnormal   Collection Time: 08/23/20  2:55 PM  Result Value Ref Range   WBC 10.4 4.0 - 10.5 K/uL   RBC 4.79 3.87 - 5.11 MIL/uL   Hemoglobin 12.5 12.0 - 15.0 g/dL   HCT 82.9 56.2 - 13.0 %   MCV 78.9 (L) 80.0 - 100.0 fL   MCH 26.1 26.0 - 34.0 pg   MCHC 33.1 30.0 - 36.0 g/dL   RDW 86.5 78.4 - 69.6 %   Platelets 431 (H) 150 - 400 K/uL   nRBC 0.0 0.0 - 0.2 %  hCG, quantitative, pregnancy     Status: Abnormal   Collection Time: 08/23/20  2:55 PM  Result Value Ref Range   hCG, Beta Chain, Quant, S 44 (H) <5 mIU/mL   US OB LESS THAN 14 WEEKS WITH OB TRANSVAGINAL  Result Date: 08/23/2020 CLINICAL DATA:  Abdominal pain. Gestational age by last menstrual period of 4 weeks 6 days. Beta hCG not available at the time of this dictation. EXAM: OBSTETRIC <14 WK [place] AND TRANSVAGINAL OB US TECHNIQUE: Both transabdominal and transvaginal ultrasound examinations were performed for complete evaluation of the gestation as well as the maternal uterus, adnexal regions, and pelvic cul-de-sac. Transvaginal technique was performed to assess early pregnancy. COMPARISON:  Obstetric ultrasound dated 12/09/2019. FINDINGS: Intrauterine gestational sac: None Yolk sac:  Not Visualized. Embryo:  Not Visualized. Cardiac Activity: Not Visualized. Subchorionic hemorrhage:  None visualized. Maternal uterus/adnexae: Normal appearing. Small volume free intraperitoneal fluid. IMPRESSION: No intrauterine pregnancy is identified. Small volume free intraperitoneal fluid. Electronically Signed   By: Romona Curls M.D.   On: 08/23/2020 15:58    MDM UA, UPT CBC, HCG ABO/Rh-  Wet prep and gc/chlamydia US OB Comp Less 14 weeks with Transvaginal  1600- Dr. Amado Nash notified of patient arrival and pregnancy of unknown  location. Dr. Amado Nash attempted to look up results from [facility] on Friday but it had not resulted. Will have patient have repeat HCG in office this week, MD to send message to office RNs and patient instructed to call office tomorrow to schedule appointment.   Discussed with client the diagnosis of pregnancy of unknown anatomic location.  Three possibilities of outcome are: a healthy pregnancy that is too early to see a yolk sac to  confirm the pregnancy is in the uterus, a pregnancy that is not healthy and has not developed and will not develop, and an ectopic pregnancy that is in the abdomen that cannot be identified at this time.  And ectopic pregnancy can be a life threatening situation as a pregnancy needs to be in the uterus which is a muscle and can stretch to accommodate the growth of a pregnancy.  Other structures in the pelvis and abdomen as not muscular and do not stretch with the growth of a pregnancy.  Worst case scenario is that a structure ruptures with a growing pregnancy not in the uterus and and internal hemorrhage can be a life threatening situation.  We need to follow the progression of this pregnancy carefully.  We need to check another serum pregnancy hormone level to determine if the levels are rising appropriately  and to determine the next steps that are needed for you. Patient's questions were answered.   Assessment and Plan   1. Pregnancy of unknown anatomic location   2. Abdominal pain affecting pregnancy   3. [redacted] weeks gestation of pregnancy    -Discharge home in stable condition -Strict ectopic precautions discussed -Patient advised to follow-up with [facility] this week for repeat HCG -Patient may return to MAU as needed or if her condition were to change or worsen   Rolm Bookbinder CNM 08/23/2020, 2:59 PM

## 2020-09-09 ENCOUNTER — Other Ambulatory Visit: Payer: Self-pay | Admitting: Obstetrics & Gynecology

## 2020-09-09 ENCOUNTER — Inpatient Hospital Stay (HOSPITAL_COMMUNITY)
Admission: AD | Admit: 2020-09-09 | Discharge: 2020-09-09 | Disposition: A | Discharge status: Home / Self Care | Payer: 59 | Attending: Obstetrics and Gynecology | Admitting: Obstetrics and Gynecology

## 2020-09-09 ENCOUNTER — Other Ambulatory Visit: Payer: Self-pay

## 2020-09-09 DIAGNOSIS — Z9079 Acquired absence of other genital organ(s): Secondary | ICD-10-CM | POA: Diagnosis not present

## 2020-09-09 DIAGNOSIS — Z3A00 Weeks of gestation of pregnancy not specified: Secondary | ICD-10-CM | POA: Insufficient documentation

## 2020-09-09 DIAGNOSIS — O009 Unspecified ectopic pregnancy without intrauterine pregnancy: Secondary | ICD-10-CM | POA: Diagnosis present

## 2020-09-09 DIAGNOSIS — Z8759 Personal history of other complications of pregnancy, childbirth and the puerperium: Secondary | ICD-10-CM | POA: Insufficient documentation

## 2020-09-09 DIAGNOSIS — O0000 Abdominal pregnancy without intrauterine pregnancy: Secondary | ICD-10-CM

## 2020-09-09 LAB — CBC WITH DIFFERENTIAL/PLATELET
Abs Immature Granulocytes: 0.04 10*3/uL (ref 0.00–0.07)
Basophils Absolute: 0.1 10*3/uL (ref 0.0–0.1)
Basophils Relative: 1 %
Eosinophils Absolute: 0.2 10*3/uL (ref 0.0–0.5)
Eosinophils Relative: 2 %
HCT: 36.4 % (ref 36.0–46.0)
Hemoglobin: 12.1 g/dL (ref 12.0–15.0)
Immature Granulocytes: 0 %
Lymphocytes Relative: 28 %
Lymphs Abs: 2.5 10*3/uL (ref 0.7–4.0)
MCH: 26.5 pg (ref 26.0–34.0)
MCHC: 33.2 g/dL (ref 30.0–36.0)
MCV: 79.6 fL — ABNORMAL LOW (ref 80.0–100.0)
Monocytes Absolute: 0.8 10*3/uL (ref 0.1–1.0)
Monocytes Relative: 9 %
Neutro Abs: 5.5 10*3/uL (ref 1.7–7.7)
Neutrophils Relative %: 60 %
Platelets: 394 10*3/uL (ref 150–400)
RBC: 4.57 MIL/uL (ref 3.87–5.11)
RDW: 13.8 % (ref 11.5–15.5)
WBC: 9.1 10*3/uL (ref 4.0–10.5)
nRBC: 0 % (ref 0.0–0.2)

## 2020-09-09 LAB — COMPREHENSIVE METABOLIC PANEL
ALT: 18 U/L (ref 0–44)
AST: 18 U/L (ref 15–41)
Albumin: 4 g/dL (ref 3.5–5.0)
Alkaline Phosphatase: 45 U/L (ref 38–126)
Anion gap: 5 (ref 5–15)
BUN: 6 mg/dL (ref 6–20)
CO2: 25 mmol/L (ref 22–32)
Calcium: 9.9 mg/dL (ref 8.9–10.3)
Chloride: 105 mmol/L (ref 98–111)
Creatinine, Ser: 0.48 mg/dL (ref 0.44–1.00)
GFR, Estimated: >60 mL/min (ref >60)
Glucose, Bld: 109 mg/dL — ABNORMAL HIGH (ref 70–99)
Potassium: 4.2 mmol/L (ref 3.5–5.1)
Sodium: 135 mmol/L (ref 135–145)
Total Bilirubin: 1.4 mg/dL — ABNORMAL HIGH (ref 0.3–1.2)
Total Protein: 7.1 g/dL (ref 6.5–8.1)

## 2020-09-09 LAB — HCG, QUANTITATIVE, PREGNANCY: hCG, Beta Chain, Quant, S: 1010 m[IU]/mL — ABNORMAL HIGH

## 2020-09-09 MED ORDER — METHOTREXATE FOR ECTOPIC PREGNANCY
50.0000 mg/m2 | Freq: Once | INTRAMUSCULAR | Status: AC
  Administered 2020-09-09: 93.5 mg via INTRAMUSCULAR
  Filled 2020-09-09: qty 3.74

## 2020-09-09 NOTE — MAU Note (Signed)
Sent by MD office for bloodwork and injection per pt's spouse.  No VB or pain.
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
## 2020-09-09 NOTE — Progress Notes (Signed)
Dr. Juliene Pina instructed RN to administer Methotrexate as ordered and have pt return to MAU on Saturday for f/u HCG level.

## 2020-09-09 NOTE — Discharge Instructions (Signed)
 Methotrexate injection What is this medicine? METHOTREXATE (METH oh TREX ate) is a chemotherapy medicine. It treats certain types of cancer. Some of the cancers treated are breast cancer, head and neck cancer, leukemia, lymphoma, and osteosarcoma. This medicine can also be used to treat psoriasis and certain kinds of arthritis. This medicine may be used for other purposes; ask your health care provider or pharmacist if you have questions. What should I tell my health care provider before I take this medicine? They need to know if you have any of these conditions:  fluid in the stomach area or lungs  if you often drink alcohol  infection or immune system problems  kidney disease  liver disease  low blood counts (white cells, platelets, or red blood cells)  lung disease  recent or ongoing radiation  recent or upcoming vaccine  stomach ulcers  ulcerative colitis  an unusual or allergic reaction to methotrexate, other medicines, foods, dyes, or preservatives  pregnant or trying to get pregnant  breast-feeding How should I use this medicine? This medicine is for infusion into a vein or for injection into muscle or into the spinal fluid (whichever applies). It is usually given by a health care professional in a hospital or clinic setting. In rare cases, you might get this medicine at home. You will be taught how to give this medicine. Use exactly as directed. Take your medicine at regular intervals. Do not take your medicine more often than directed. If this medicine is used for arthritis or psoriasis, it should be taken weekly, NOT daily. It is important that you put your used needles and syringes in a special sharps container. Do not put them in a trash can. If you do not have a sharps container, call your pharmacist or healthcare provider to get one. Talk to your pediatrician regarding the use of this medicine in children. While this drug may be prescribed for children as  young as 2 years for selected conditions, precautions do apply. Overdosage: If you think you have taken too much of this medicine contact a poison control center or emergency room at once. NOTE: This medicine is only for you. Do not share this medicine with others. What if I miss a dose? It is important not to miss your dose. Call your doctor or health care professional if you are unable to keep an appointment. If you give yourself the medicine and you miss a dose, talk with your doctor or health care professional. Do not take double or extra doses. What may interact with this medicine? Do not take this medicine with any of the following medications:  acitretin This medicine may also interact with the following medications:  aspirin or aspirin-like medicines including salicylates  azathioprine  certain antibiotics like chloramphenicol, penicillin, tetracycline  certain medicines that treat or prevent blood clots like warfarin, apixaban, dabigatran, and rivaroxaban  certain medicines for stomach problems like esomeprazole, omeprazole, pantoprazole  cyclosporine  dapsone  diuretics  folic acid  gold  hydroxychloroquine  live virus vaccines  medicines for infection like acyclovir, adefovir, amphotericin B, bacitracin, cidofovir, foscarnet, ganciclovir, gentamicin, pentamidine, vancomycin  mercaptopurine  NSAIDs, medicines for pain and inflammation, like ibuprofen or naproxen  pamidronate  pemetrexed  penicillamine  phenylbutazone  phenytoin  probenacid  pyrimethamine  retinoids such as isotretinoin and tretinoin  steroid medicines like prednisone or cortisone  sulfonamides like sulfasalazine and trimethoprim/sulfamethoxazole  theophylline  zoledronic acid This list may not describe all possible interactions. Give your health care provider   a list of all the medicines, herbs, non-prescription drugs, or dietary supplements you use. Also tell them if you  smoke, drink alcohol, or use illegal drugs. Some items may interact with your medicine. What should I watch for while using this medicine? This medicine may make you feel generally unwell. This is not uncommon as chemotherapy can affect healthy cells as well as cancer cells. Report any side effects. Continue your course of treatment even though you feel ill unless your health care provider tells you to stop. Your condition will be monitored carefully while you are receiving this medicine. Avoid alcoholic drinks. This medicine can cause serious side effects. To reduce the risk, your health care provider may give you other medicines to take before receiving this one. Be sure to follow the directions from your health care provider. This medicine can make you more sensitive to the sun. Keep out of the sun. If you cannot avoid being in the sun, wear protective clothing and use sunscreen. Do not use sun lamps or tanning beds/booths. You may get drowsy or dizzy. Do not drive, use machinery, or do anything that needs mental alertness until you know how this medicine affects you. Do not stand or sit up quickly, especially if you are an older patient. This reduces the risk of dizzy or fainting spells. You may need blood work while you are taking this medicine. Call your doctor or health care professional for advice if you get a fever, chills or sore throat, or other symptoms of a cold or flu. Do not treat yourself. This drug decreases your body's ability to fight infections. Try to avoid being around people who are sick. This medicine may increase your risk to bruise or bleed. Call your doctor or health care professional if you notice any unusual bleeding. Be careful brushing or flossing your teeth or using a toothpick because you may get an infection or bleed more easily. If you have any dental work done, tell your dentist you are receiving this medicine Check with your doctor or health care professional if you  get an attack of severe diarrhea, nausea and vomiting, or if you sweat a lot. The loss of too much body fluid can make it dangerous for you to take this medicine. Talk to your doctor about your risk of cancer. You may be more at risk for certain types of cancers if you take this medicine. Do not become pregnant while taking this medicine or for 6 months after stopping it. Women should inform their health care provider if they wish to become pregnant or think they might be pregnant. Men should not father a child while taking this medicine and for 3 months after stopping it. There is potential for serious harm to an unborn child. Talk to your health care provider for more information. Do not breast-feed an infant while taking this medicine or for 1 week after stopping it. This medicine may make it more difficult to get pregnant or father a child. Talk to your health care provider if you are concerned about your fertility. What side effects may I notice from receiving this medicine? Side effects that you should report to your doctor or health care professional as soon as possible:  allergic reactions like skin rash, itching or hives, swelling of the face, lips, or tongue  back pain  breathing problems or shortness of breath  confusion  diarrhea  dry, nonproductive cough  low blood counts - this medicine may decrease the number of white   blood cells, red blood cells and platelets. You may be at increased risk of infections and bleeding  mouth sores  redness, blistering, peeling or loosening of the skin, including inside the mouth  seizures  severe headaches  signs of infection - fever or chills, cough, sore throat, pain or difficulty passing urine  signs and symptoms of bleeding such as bloody or black, tarry stools; red or dark-brown urine; spitting up blood or brown material that looks like coffee grounds; red spots on the skin; unusual bruising or bleeding from the eye, gums, or  nose  signs and symptoms of kidney injury like trouble passing urine or change in the amount of urine  signs and symptoms of liver injury like dark yellow or brown urine; general ill feeling or flu-like symptoms; light-colored stools; loss of appetite; nausea; right upper belly pain; unusually weak or tired; yellowing of the eyes or skin  stiff neck  vomiting Side effects that usually do not require medical attention (report to your doctor or health care professional if they continue or are bothersome):  dizziness  hair loss  headache  stomach pain  upset stomach This list may not describe all possible side effects. Call your doctor for medical advice about side effects. You may report side effects to FDA at 1-800-FDA-1088. Where should I keep my medicine? This medicine is given in a hospital or clinic. It will not be stored at home. NOTE: This sheet is a summary. It may not cover all possible information. If you have questions about this medicine, talk to your doctor, pharmacist, or health care provider.  2021 Elsevier/Gold Standard (2019-08-20 10:19:36)  

## 2020-09-12 ENCOUNTER — Other Ambulatory Visit: Payer: Self-pay

## 2020-09-12 ENCOUNTER — Inpatient Hospital Stay (HOSPITAL_COMMUNITY)
Admission: AD | Admit: 2020-09-12 | Discharge: 2020-09-12 | Disposition: A | Discharge status: Home / Self Care | Payer: 59 | Attending: Obstetrics & Gynecology | Admitting: Obstetrics & Gynecology

## 2020-09-12 DIAGNOSIS — O009 Unspecified ectopic pregnancy without intrauterine pregnancy: Secondary | ICD-10-CM | POA: Diagnosis present

## 2020-09-12 LAB — HCG, QUANTITATIVE, PREGNANCY: hCG, Beta Chain, Quant, S: 724 m[IU]/mL — ABNORMAL HIGH (ref <5)

## 2020-09-15 ENCOUNTER — Inpatient Hospital Stay (HOSPITAL_COMMUNITY)
Admission: AD | Admit: 2020-09-15 | Discharge: 2020-09-15 | Disposition: A | Discharge status: Home / Self Care | Payer: 59 | Attending: Obstetrics | Admitting: Obstetrics

## 2020-09-15 ENCOUNTER — Other Ambulatory Visit: Payer: Self-pay

## 2020-09-15 ENCOUNTER — Other Ambulatory Visit: Payer: Self-pay | Admitting: Obstetrics & Gynecology

## 2020-09-15 ENCOUNTER — Other Ambulatory Visit: Payer: Self-pay | Admitting: Obstetrics and Gynecology

## 2020-09-15 DIAGNOSIS — O00101 Right tubal pregnancy without intrauterine pregnancy: Secondary | ICD-10-CM | POA: Insufficient documentation

## 2020-09-15 DIAGNOSIS — O00109 Unspecified tubal pregnancy without intrauterine pregnancy: Secondary | ICD-10-CM

## 2020-09-15 LAB — HCG, QUANTITATIVE, PREGNANCY: hCG, Beta Chain, Quant, S: 258 m[IU]/mL — ABNORMAL HIGH (ref <5)

## 2020-09-15 NOTE — MAU Note (Signed)
Sent from MD office for f/u lab work.  Reports spotting, denies abdominal pain, has H/A.

## 2020-09-15 NOTE — Progress Notes (Signed)
Dr. Katherene Ponto spoke to pt and her spouse regarding the POC.  Will call pt with lab results.  Pt verbalized understanding and agreeable with POC.

## 2021-04-18 NOTE — L&D Delivery Note (Signed)
OB/GYN Faculty Practice Delivery Note  Kathryn Navarro is a 26 y.o. G3P0020 s/p VD at [redacted]w[redacted]d. She was admitted for PPROM.   ROM: 68h 74m with clear fluid GBS Status: Negative/-- (05/22 0000) Maximum Maternal Temperature: 98.43F  Labor Progress: She was admitted on 5/20 due to PPROM with clear fluid. She then progressed into pre-term labor with consistent contractions despite tocolysis. Her cerclage was removed today and noted to be essentially complete afterwards. She then had AROM of a remaining forebag and pit for consistency of contractions.    Delivery Date/Time: 2308  Delivery: Called to room and patient was feeling more pressure. Head delivered middle OA. No nuchal cord present. Shoulder and body delivered in usual fashion. Infant with spontaneous cry, placed on mother's abdomen, dried and stimulated. Cord clamped x 2 after 1-minute delay, and cut by FOB. Infant then taken to warmer for NICU team to evaluate. Cord blood drawn. Placenta delivered spontaneously with gentle cord traction. Majority of EBL was before and immediately after placenta removal. TXA was given. Fundus firm with massage, lower uterine sweep, and Pitocin. Placenta appeared intact, but did have a few small pieces of membrane retrieved with sweep. Labia, perineum, vagina, and cervix inspected with a 2nd degree perineal laceration that was repaired with 2.0 and 3.0 vicryl. 2 interrupted stiches were placed into the deep layer and then remaining repaired in usual fashion.    Baby Weight: pending  Placenta: 3 vessel, intact. Sent to pathology.  Complications: None Lacerations: 2nd degree perineal  EBL: 650 mL Analgesia: Epidural   Infant:  APGAR (1 MIN): 8   APGAR (5 MINS): 9    Leticia Penna, DO  OB Family Medicine Fellow, [facility] for [employer], [facility] 09/07/2021, 12:00 AM

## 2021-06-10 ENCOUNTER — Ambulatory Visit (INDEPENDENT_AMBULATORY_CARE_PROVIDER_SITE_OTHER): Payer: Self-pay | Admitting: Obstetrics and Gynecology

## 2021-06-10 ENCOUNTER — Other Ambulatory Visit: Payer: Self-pay

## 2021-06-10 ENCOUNTER — Other Ambulatory Visit (HOSPITAL_COMMUNITY)
Admission: RE | Admit: 2021-06-10 | Discharge: 2021-06-10 | Disposition: A | Discharge status: Home / Self Care | Payer: Self-pay | Source: Ambulatory Visit | Attending: Obstetrics and Gynecology | Admitting: Obstetrics and Gynecology

## 2021-06-10 ENCOUNTER — Encounter: Payer: Self-pay | Admitting: Obstetrics and Gynecology

## 2021-06-10 ENCOUNTER — Other Ambulatory Visit: Payer: Self-pay | Admitting: Obstetrics and Gynecology

## 2021-06-10 DIAGNOSIS — E282 Polycystic ovarian syndrome: Secondary | ICD-10-CM

## 2021-06-10 DIAGNOSIS — O3432 Maternal care for cervical incompetence, second trimester: Secondary | ICD-10-CM

## 2021-06-10 DIAGNOSIS — O0992 Supervision of high risk pregnancy, unspecified, second trimester: Secondary | ICD-10-CM | POA: Insufficient documentation

## 2021-06-10 DIAGNOSIS — N942 Vaginismus: Secondary | ICD-10-CM | POA: Insufficient documentation

## 2021-06-10 DIAGNOSIS — O99210 Obesity complicating pregnancy, unspecified trimester: Secondary | ICD-10-CM | POA: Insufficient documentation

## 2021-06-10 DIAGNOSIS — O26872 Cervical shortening, second trimester: Secondary | ICD-10-CM

## 2021-06-10 DIAGNOSIS — O0990 Supervision of high risk pregnancy, unspecified, unspecified trimester: Secondary | ICD-10-CM | POA: Insufficient documentation

## 2021-06-10 DIAGNOSIS — O132 Gestational [pregnancy-induced] hypertension without significant proteinuria, second trimester: Secondary | ICD-10-CM

## 2021-06-10 DIAGNOSIS — Z6832 Body mass index (BMI) 32.0-32.9, adult: Secondary | ICD-10-CM

## 2021-06-10 NOTE — Patient Instructions (Signed)
AREA PEDIATRIC/FAMILY PRACTICE PHYSICIANS  Central/Southeast [place] ([address]) [facility] Chambliss, MD; Eniola, MD; Hale, MD; Hensel, MD; McDiarmid, MD; McIntyer, MD; Neal, MD; Walden, MD [address] [place], [place] [address] [phone] Mon-Fri 8:30-12:30, 1:30-5:00 Providers come to see babies at [facility] Accepting Medicaid [facility] providers who accept newborns: Koirala, MD; Morrow, MD; Wolters, MD [address], [place], [place] [address] [phone] Mon-Fri 8:00-5:30 Babies seen by providers at [facility] Does NOT accept Medicaid Please call early in hospitalization for appointment (limited availability)  [facility] Mulberry, MD [address] [place], [place] [address] [phone] Mon, Tue, Thur, Fri 8:30-5:00, Wed 10:00-7:00 (closed 1-2pm) Babies seen by [facility] providers Accepting Medicaid Rubin - Pediatrician Rubin, MD [address], [place], [place] [address] [phone] Mon-Fri 8:30-5:00, Sat 8:30-12:00 Provider comes to see babies at [facility] Accepting Medicaid Must have been referred from current patients or contacted office prior to delivery [facility] ([facility]) Brown, MD; Chandler, MD; Ettefagh, MD; Grant, MD; Lester, MD; McCormick, MD; McQueen, MD; Prose, MD; Simha, MD; Stanley, MD; Stryffeler, NP; Tebben, NP [address], [place], [place] [address] [phone] Mon, Tue, Thur, Fri 8:30-5:30, Wed 9:30-5:30, Sat 8:30-12:30 Babies seen by [facility] providers Accepting Medicaid Only accepting infants of first-time parents or siblings of current patients Hospital discharge coordinator will make follow-up appointment Jack Amos [address], [place], [place]  [address] [phone]   Fax - [phone] [facility] [address], [place], [place]  [address] Phone - [phone]   Fax - [phone] Shilpa Gosrani [address], [place], [place]  [address] [phone]  East/[place]ortheast [place] ([address]) [facility] Bates, MD; Brassfield, MD; Cooper, Cox, MD; MD; Davis, MD; Dovico, MD; Ettefaugh, MD; Little, MD; Lowe, MD; Keiffer, MD; Melvin, MD; Sumner, MD; Williams, MD [address], [place], [place] [address] [phone] Mon-Fri 8:30-5:00 (extended evenings Mon-Thur as needed), Sat-Sun 10:00-1:00 Providers come to see babies at [facility] Accepting Medicaid for families of first-time babies and families with all children in the household age 3 and under. Must register with office prior to making appointment (M-F only). [facility] Henson, NP; Knapp, MD; Lalonde, MD; Tysinger, PA [address] [place], [place] [address] [phone] Mon-Fri 8:00-5:00 Babies seen by providers at [facility] Does NOT accept Medicaid/Commercial Insurance Only [facility] ([facility])  Artis, MD; Barnes, MD; Bratton, MD; Coccaro, MD; Lockett Gardner, MD; Kramer, MD; Marshall, MD; Netherton, MD; Poleto, MD; Skinner, MD [address] [place], [place] [address] [phone] Mon-Fri 8:30-5:30, Sat (Oct.-Mar.) 9:00-1:00 Babies seen by providers at [facility] Accepting Medicaid  [place] ([address]) [facility] Reid, MD; Warner, MD [address], [place], [place] [address] [phone] Mon-Fri 8:30-5:00, Sat 8:30-12:00 Providers come to see babies at [facility] Does NOT accept Medicaid [facility] Becker, PA; Hagler, MD; Scifres, PA; Sun, MD; Swayne, MD [address], [place],  [address] [phone] Mon-Fri 8:00-5:00 Babies seen by providers at [facility] Does NOT accept Medicaid Only accepting babies of parents who  are patients Please call early in hospitalization for appointment (limited availability) [facility] Clark, MD; Frye, MD; Kelleher, MD; Mack, NP; Miller, MD; O'Keller, MD; Patterson, NP; Pudlo, MD; Puzio, MD; Thomas, MD; Tucker, MD; Twiselton, MD [address], [place], [place] [address] [phone] Mon-Fri 8:00-5:00, Sat 9:00-12:00 Providers come to see babies at [facility] Does NOT accept Medicaid  [place] ([address]) [facility] providers accepting new patients: Brake, NP; Wharton, PA [address], [place], [place] [address] [phone] Mon-Fri 8:00-5:00 Babies seen by providers at [facility] Does NOT accept Medicaid Only accepting babies of parents who are patients Please call early in hospitalization for appointment (limited availability) [facility] Gay, MD; Quinlan, MD [address] [place], [place] [address] [phone] (press 1 to schedule appointment) Mon-Fri 8:00-5:00 Providers come to see babies at [facility] Does NOT accept Medicaid [facility] Mazer, MD [address] [place], [place] [address] [phone] Mon-Fri 8:30-5:00 (lunch 12:30-1:00), extended hours by appointment only Wed 5:00-6:30 Babies seen by [facility] providers Accepting Medicaid [facility] Banks, MD; Jordan, MD; Koberlein, MD [address], [place], [place] [address] [phone] Mon-Fri 8:00-5:00 Babies seen by [facility] providers Does NOT accept Medicaid [facility] Parker, MD; Hunter, MD; Wallace, DO [address] [place], [place] [address] [phone] Mon-Fri 8:00-5:00 Babies seen by [facility] providers Does NOT accept Medicaid [facility] Brandon, PA; Brecken, PA; Christy, NP; Dees, MD; DeClaire, MD; DeWeese, MD; Hansen, NP; Mills, NP; Parrish, NP; Smoot, NP; Summer, MD; Vapne,  MD [address] [place], [place] [address] [phone] Mon-Fri 8:30-5:00, Sat 10:00-1:00 Providers come to see babies at [facility] Does NOT accept Medicaid Free prenatal information session Tuesdays at 4:45pm [facility] Bouska, MD; Gordon, PA; Jeffery, PA; Weber, PA [address] [place] [address] [phone] Mon-Fri 7:30-5:30 Babies seen by [facility] providers [facility] [address], [place], [place]  [address] [phone]   Fax - [phone]  [place] ([address] & [address]) [facility] [place] [address] [place], [place] [address] [phone] Mon-Thur 8:00-6:00 Providers come to see babies at [facility] Accepting Medicaid [facility] Anderson, NP; Badger, MD; Beal, PA; Spencer, PA [address] [place], [place] [address] [phone] Mon-Thur 7:30-7:30, Fri 7:30-4:30 Babies seen by [facility] providers Accepting Medicaid [facility] Agbuya, MD; Klett, NP; Romgoolam, MD [address], [place], [place] [address] [phone] Mon-Fri 8:30-5:00, Sat 8:30-12:00 Providers come to see babies at [facility] Accepting Medicaid Must have "Meet & Greet" appointment at office prior to delivery [facility] McCord, MD; Wallace, MD; Wood, MD [address], [place], [place] [address] [phone] Mon-Wed 8:00-6:00, Thur-Fri 8:00-5:00, Sat 9:00-12:00 Providers come to see babies at [facility] Does NOT accept Medicaid Only accepting siblings of current patients [facility]  [address], [place], [place]  [address] [phone]   Fax - [phone] [facility] [address], [place], [place]  [address] [phone]   Fax -  [phone]  [place] ([address] & [address]) [facility] Cirigliano, DO; Matthews, DO [address] [place], [place] [address] [phone] Mon-Fri 7:00-5:00 Babies seen by [facility] providers Does NOT accept Medicaid [facility] Briscoe, MD; Howley, PA; Moreira, PA [address], [place], [place] [address] [phone] Mon-Fri 8:00-5:00 Babies seen by [facility] providers Accepting Medicaid [facility] Boyd, MD; Church, PA; Jones, NP; Osborn, PA [address], [place], [place] [address] [phone] Mon-Fri 8:00-5:00 Babies seen by providers at [facility] Accepting Medicaid  [place] ([address]) [facility] [place] Wendling, DO [address] [place], [place] [address] [phone] Mon-Fri 8:00-5:00 Babies seen by [facility] providers Does NOT accept Medicaid Limited availability, please call early in hospitalization to schedule follow-up [facility] Calderon, PA; Cummings, MD; Dillard, MD; Martin, PA; Olson, MD; VanDeven, PA [address], [place], [place] [address] [phone] Mon-Fri 8:30-5:00, Sat 9:00-12:00 Babies seen by providers at [facility] Accepting Medicaid Please register online then schedule online or call office [email or web address] Hunter, NP; Kumar, MD; Martin Rogers, PA [address], [place], [place] [address] [phone] Mon-Fri 8:00-5:00 Babies seen by providers at [facility] Accepting Medicaid [facility] Griffith, MD; Kristi Fleenor, NP; West, MD [address], [place], [place] [address] [phone] Mon-Fri 8:00-5:30, Sat&Sun by appointment (phones open at  8:30) Babies seen by [facility] providers Accepting Medicaid Must be a first-time baby or sibling of current patient [facility] [place]  [address], [place], [place]  [address] [phone]   Fax - [phone]  [place] ([address]) [facility] Brown, PA; Cowen, PA; Rice, MD; Helton, PA; Spry, MD [address] [place], [place] [address] [phone] Mon-Thur 8:00-7:00, Fri 8:00-5:00, Sat 8:00-12:00, Sun 9:00-12:00 Babies seen by [facility] providers Accepting Medicaid [facility] Coe-Goins, MD; Marshall, MD; Pierre-Louis, MD [address], [place], [place] [address] [phone] Mon-Thur 8:00-5:00 Babies seen by providers at [facility] Accepting Medicaid [facility] Bratton, MD; Coe-Goins, MD; Hayes, MD; Lewis, MD; List, MD; Lott, MD; Marshall, MD; Moran, MD; O'Neal, MD; Pierre-Louis, MD; Pitonzo, MD; Scholer, MD; Spangle, MD [address] [place], [place] [address] [phone] Mon-Fri 8:00-5:30, Sat (Oct.-Mar.) 9:00-1:00 Babies seen by providers at [facility] Accepting Medicaid Must fill out new patient packet, available online at [email or web address] [facility] ([facility]) Friddle, NP; Harris, NP; Kelly, NP; Logan, MD; Melvin, PA; Poth, MD; Ramadoss, MD; Stanton, NP [address], [place], [place] [address] [phone] Mon-Thur 8:00-5:30, Fri 8:00-5:00 Babies seen by providers at [facility] Accepting Medicaid  [place] ([address]) [facility] Dixon, PA; [place], MD; Pickard, MD; Tapia, PA [address], [place], [place] [address] [phone] Mon-Fri 8:00-5:00 Babies seen by providers at [facility] Accepting Medicaid   [place] ([address]) [facility] [place] Masneri, DO; Meyers, MD; Nelson, PA [address], [place], [place] [address] [phone] Mon-Fri 8:00-5:00 Babies seen by providers at [facility] Does NOT accept Medicaid Limited appointment availability, please call early in hospitalization  [facility] [place] Kunedd, DO; McGowen, MD [address], [place], [place] [address] [phone] Mon-Fri 8:00-5:00 Babies seen by [facility] providers Does NOT accept Medicaid [facility] [place] Cameron, MD; MacDonald, MD; Michaels, PA; Nayak, MD [address] [place] [address], [place], [place] [address] [phone] Mon-Fri 8:00-5:00 After hours clinic ([address] [place],  [address]) [phone] Mon-Fri 5:00-8:00, Sat 12:00-6:00, Sun 10:00-4:00 Babies seen by [facility] providers Accepting Medicaid [facility] [place] [address], [place], [place]  [address] [phone]   Fax - [phone]  [place] ([address]) [facility] [place] [facility] Andy, MD [address], [place], [place] [address] [phone] Mon-Fri 8:00-5:00 Babies seen by [facility] providers Does NOT accept Medicaid [facility] [place] ([facility] [place]) Eksir, MD [address], [place], [place] [address] [phone] Mon-Thur 8:00-7:00, Fri 8:00-5:00, Sat 8:00-12:00 Babies seen by providers at [facility] Accepting Medicaid - but does not have vaccinations in office (must be received elsewhere) Limited availability, please call early in hospitalization  [place] ([address]) [facility]  Charlene Flemming, MD [address], [place] [address] [phone]  Fax [phone]  [facility]  Kimberly Newton, MD, Annamarie Streilein, PA, Carla [place], PA [address] [place], [place]  [address] [phone] [facility]  [address], [place], [place] [address] [phone] [address], [place], [place] [address] [phone] (West Office)  [facility] [address], [place], [place] [address] [phone] [facility] [address], [place], [place] [address] [phone] [facility] [address], [place], [place] [address] [phone] [facility] [address], [place], [place] [address] [phone] [facility] [address], [place], [place] [address] [phone] [facility] [address], [place], [place] [address] [phone] [facility]  [address], [place], [place] [address] [phone] Dr[facility] [address] [place] [address], [place], [place] [address] [phone] [facility] [address], [place], [place] [address] [phone] [facility] [address], , [place] [address] [phone]  

## 2021-06-10 NOTE — Progress Notes (Signed)
Anatomy ultrasound scheduled for 06/29/21 at 1:45 PM. Patient notified.   Transvaginal ultrasound for cervical length scheduled for 06/17/20 at 12:45 PM. Patient notified.

## 2021-06-11 LAB — CBC/D/PLT+RPR+RH+ABO+RUBIGG...
Antibody Screen: NEGATIVE
Basophils Absolute: 0.1 10*3/uL (ref 0.0–0.2)
Basos: 1 %
EOS (ABSOLUTE): 0.4 10*3/uL (ref 0.0–0.4)
Eos: 3 %
HCV Ab: NONREACTIVE
HIV Screen 4th Generation wRfx: NONREACTIVE
Hematocrit: 37.8 % (ref 34.0–46.6)
Hemoglobin: 12.7 g/dL (ref 11.1–15.9)
Hepatitis B Surface Ag: NEGATIVE
Immature Grans (Abs): 0.1 10*3/uL (ref 0.0–0.1)
Immature Granulocytes: 1 %
Lymphocytes Absolute: 2.4 10*3/uL (ref 0.7–3.1)
Lymphs: 18 %
MCH: 27.7 pg (ref 26.6–33.0)
MCHC: 33.6 g/dL (ref 31.5–35.7)
MCV: 82 fL (ref 79–97)
Monocytes Absolute: 1 10*3/uL — ABNORMAL HIGH (ref 0.1–0.9)
Monocytes: 8 %
Neutrophils Absolute: 9.3 10*3/uL — ABNORMAL HIGH (ref 1.4–7.0)
Neutrophils: 69 %
Platelets: 396 10*3/uL (ref 150–450)
RBC: 4.59 x10E6/uL (ref 3.77–5.28)
RDW: 14.1 % (ref 11.7–15.4)
RPR Ser Ql: NONREACTIVE
Rh Factor: POSITIVE
Rubella Antibodies, IGG: 6.22 index (ref >0.99)
WBC: 13.3 10*3/uL — ABNORMAL HIGH (ref 3.4–10.8)

## 2021-06-11 LAB — PROTEIN / CREATININE RATIO, URINE
Creatinine, Urine: 40.2 mg/dL
Protein, Ur: 8.2 mg/dL
Protein/Creat Ratio: 204 mg/g creat — ABNORMAL HIGH (ref 0–200)

## 2021-06-11 LAB — GC/CHLAMYDIA PROBE AMP (~~LOC~~) NOT AT ARMC
Chlamydia: NEGATIVE
Comment: NEGATIVE
Comment: NORMAL
Neisseria Gonorrhea: NEGATIVE

## 2021-06-11 LAB — HCV INTERPRETATION

## 2021-06-12 LAB — CULTURE, OB URINE

## 2021-06-12 LAB — URINE CULTURE, OB REFLEX

## 2021-06-12 NOTE — Progress Notes (Addendum)
PRENATAL VISIT NOTE  Transfer of care from [facility] due to insurance  Subjective:  Kathryn Navarro is a 26 y.o. G3P0020 at [redacted]w[redacted]d being seen today for ongoing prenatal care.  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  Patient reports no complaints.  Contractions: Not present. Vag. Bleeding: None.  Movement: Present. Denies leaking of fluid.   The following portions of the patient's history were reviewed and updated as appropriate: allergies, current medications, past family history, past medical history, past social history, past surgical history and problem list.   Objective:   Vitals:   06/10/21 1412  BP: (!) 144/87  Pulse: (!) 118  Weight: 187 lb 8 oz (85 kg)    Fetal Status: Fetal Heart Rate (bpm): 150s   Movement: Present     General:  Alert, oriented and cooperative. Patient is in no acute distress.  Skin: Skin is warm and dry. No rash noted.   Cardiovascular: Normal heart rate noted  Respiratory: Normal respiratory effort, no problems with respiration noted  Abdomen: Soft, gravid, appropriate for gestational age.  Pain/Pressure: Absent     Pelvic: Cervical exam performed in the presence of a chaperone Dilation: Closed     Exam extremely difficult due to patient's severe intolerance. Patient became very uncomfortable with just touching the labia. I saw a blue monofilament suture but unable to appreciate if one or two cerclages. Glimpse of cervix showed it to be closed but unable to tell length due to her intolerance. Pt didn't  tolerate manual exam either  Extremities: Normal range of motion.  Edema: None  Mental Status: Normal mood and affect. Normal behavior. Normal judgment and thought content.   Assessment and Plan:  Pregnancy: G3P0020 at [redacted]w[redacted]d 1. Supervision of high risk pregnancy in second trimester Routine care. Records from [place] reviewed. - CHL AMB BABYSCRIPTS SCHEDULE OPTIMIZATION - Culture, OB Urine - GC/Chlamydia probe amp ([place])not at [facility] - CBC/D/Plt+RPR+Rh+ABO+RubIgG... - [place] MFM OB DETAIL +14 WK; Future - Hemoglobin A1c - TSH - Comprehensive metabolic panel - Protein / creatinine ratio, urine - AFP, Serum, Open Spina Bifida - Genetic Screening - US PELVIS TRANSVAGINAL NON-OB (TV ONLY); Future  2. IVF pregnancy Day five ET on 02/16/2021 in [place].  Set up fetal echo if not done by mfm. D/w them re: risks of IVF such as PTL, PTB, HTN, etc.  - AFP, Serum, Open Spina Bifida - US PELVIS TRANSVAGINAL NON-OB (TV ONLY); Future  3. Transient hypertension of pregnancy in second trimester Patient to check BPs at home. Follow up at repeat visits.   4. Short cervical length during pregnancy in second trimester See below.  Pt states she had a transvag u/s at [place] last week and it was normal. We do not have the records. Will try to obtain. Pt set up for repeat cx length for next week. - US OB Transvaginal; Future - US PELVIS TRANSVAGINAL NON-OB (TV ONLY); Future  5. Cervical cerclage suture present in second trimester Noted at 13wks in [place]. "<45mm", cerclage placed ? Two of them?Two cerclage in place?>>Patient to try and get op note  [ ]  will need removal in the  OR due to vaginismus Per records review from [place], she had cerclage placed at 13wks for cervical length <27mm. No op note available but patient states they placed two stitches. They state they did not put her on medications afterwards.    6. PCOS (polycystic ovarian syndrome) Follow up early a1c  7. Obesity in pregnancy  8.  BMI 32.0-32.9,adult  9. Vaginismus See above  Preterm labor symptoms and general obstetric precautions including but not limited to vaginal bleeding, contractions, leaking of fluid and fetal movement were reviewed in detail with the patient. Please refer to After Visit Summary for other counseling recommendations.   Return in about 2 weeks (around 06/24/2021) for in person, high risk ob, md visit.  Future Appointments  Date Time Provider [place]  06/17/2021  1:00 PM [facility] US1 [facility]  06/28/2021  1:15 PM Woodroe Mode, MD [facility]  06/29/2021  1:45 PM [facility] NURSE [facility]  06/29/2021  2:15 PM [facility] US2 [facility]    Aletha Halim, MD

## 2021-06-14 ENCOUNTER — Encounter: Payer: Self-pay | Admitting: Obstetrics and Gynecology

## 2021-06-14 ENCOUNTER — Telehealth: Payer: Self-pay

## 2021-06-14 DIAGNOSIS — O99810 Abnormal glucose complicating pregnancy: Secondary | ICD-10-CM | POA: Insufficient documentation

## 2021-06-14 DIAGNOSIS — O280 Abnormal hematological finding on antenatal screening of mother: Secondary | ICD-10-CM | POA: Insufficient documentation

## 2021-06-14 LAB — AFP, SERUM, OPEN SPINA BIFIDA
AFP MoM: 2.44
AFP Value: 110.6 ng/mL
Gest. Age on Collection Date: 19 weeks
Maternal Age At EDD: 28.5 yr
OSBR Risk 1 IN: 323
Test Results:: NEGATIVE
Weight: 187 [lb_av]

## 2021-06-14 LAB — COMPREHENSIVE METABOLIC PANEL
ALT: 14 IU/L (ref 0–32)
AST: 16 IU/L (ref 0–40)
Albumin/Globulin Ratio: 1.4 (ref 1.2–2.2)
Albumin: 4 g/dL (ref 3.9–5.0)
Alkaline Phosphatase: 51 IU/L (ref 44–121)
BUN/Creatinine Ratio: 13 (ref 9–23)
BUN: 5 mg/dL — ABNORMAL LOW (ref 6–20)
Bilirubin Total: <0.2 mg/dL (ref 0.0–1.2)
CO2: 16 mmol/L — ABNORMAL LOW (ref 20–29)
Calcium: 9.7 mg/dL (ref 8.7–10.2)
Chloride: 101 mmol/L (ref 96–106)
Creatinine, Ser: 0.39 mg/dL — ABNORMAL LOW (ref 0.57–1.00)
Globulin, Total: 2.8 g/dL (ref 1.5–4.5)
Glucose: 81 mg/dL (ref 70–99)
Potassium: 4 mmol/L (ref 3.5–5.2)
Sodium: 135 mmol/L (ref 134–144)
Total Protein: 6.8 g/dL (ref 6.0–8.5)
eGFR: 139 mL/min/{1.73_m2} (ref >59)

## 2021-06-14 LAB — TSH: TSH: 1.17 u[IU]/mL (ref 0.450–4.500)

## 2021-06-14 LAB — HEMOGLOBIN A1C
Est. average glucose Bld gHb Est-mCnc: 134 mg/dL
Hgb A1c MFr Bld: 6.3 % — ABNORMAL HIGH (ref 4.8–5.6)

## 2021-06-14 NOTE — Telephone Encounter (Signed)
Call placed to pt. Spoke with pt. Pt given results and recommendations per Dr Vergie Living. Pt verbalized understanding. Pt scheduled for early GTT on 3/9 at 840am. Pt aware of needing to fast before appt.  Judeth Cornfield, RN

## 2021-06-14 NOTE — Telephone Encounter (Signed)
-----   Message from [place] Bing, MD sent at 06/14/2021  8:34 AM EST ----- Needs an early 2h GTT. thanks

## 2021-06-17 ENCOUNTER — Encounter: Payer: Self-pay | Admitting: *Deleted

## 2021-06-17 ENCOUNTER — Telehealth: Payer: Self-pay

## 2021-06-17 ENCOUNTER — Ambulatory Visit: Admission: RE | Admit: 2021-06-17 | Payer: Self-pay | Source: Ambulatory Visit

## 2021-06-17 DIAGNOSIS — E119 Type 2 diabetes mellitus without complications: Secondary | ICD-10-CM | POA: Insufficient documentation

## 2021-06-17 DIAGNOSIS — O0990 Supervision of high risk pregnancy, unspecified, unspecified trimester: Secondary | ICD-10-CM

## 2021-06-17 DIAGNOSIS — O24319 Unspecified pre-existing diabetes mellitus in pregnancy, unspecified trimester: Secondary | ICD-10-CM | POA: Insufficient documentation

## 2021-06-17 DIAGNOSIS — O24919 Unspecified diabetes mellitus in pregnancy, unspecified trimester: Secondary | ICD-10-CM | POA: Insufficient documentation

## 2021-06-17 NOTE — Telephone Encounter (Signed)
mar/sw pt's spouse-advised no need to come today to Radiology appt but -still come to 06/29/2021 MFM appointment. ?

## 2021-06-18 ENCOUNTER — Other Ambulatory Visit: Payer: Self-pay | Admitting: General Practice

## 2021-06-18 DIAGNOSIS — O0990 Supervision of high risk pregnancy, unspecified, unspecified trimester: Secondary | ICD-10-CM

## 2021-06-22 ENCOUNTER — Encounter: Payer: Self-pay | Admitting: Obstetrics and Gynecology

## 2021-06-24 ENCOUNTER — Other Ambulatory Visit: Payer: Self-pay

## 2021-06-24 ENCOUNTER — Other Ambulatory Visit: Payer: 59

## 2021-06-24 DIAGNOSIS — O0990 Supervision of high risk pregnancy, unspecified, unspecified trimester: Secondary | ICD-10-CM

## 2021-06-25 ENCOUNTER — Ambulatory Visit: Payer: 59 | Attending: Obstetrics and Gynecology | Admitting: Obstetrics and Gynecology

## 2021-06-25 ENCOUNTER — Encounter: Payer: Self-pay | Admitting: *Deleted

## 2021-06-25 ENCOUNTER — Encounter: Payer: Self-pay | Admitting: Obstetrics and Gynecology

## 2021-06-25 ENCOUNTER — Other Ambulatory Visit: Payer: Self-pay | Admitting: *Deleted

## 2021-06-25 ENCOUNTER — Ambulatory Visit (HOSPITAL_BASED_OUTPATIENT_CLINIC_OR_DEPARTMENT_OTHER): Payer: 59

## 2021-06-25 ENCOUNTER — Ambulatory Visit: Payer: 59 | Attending: Obstetrics and Gynecology | Admitting: *Deleted

## 2021-06-25 VITALS — BP 132/77 | HR 115

## 2021-06-25 DIAGNOSIS — O0992 Supervision of high risk pregnancy, unspecified, second trimester: Secondary | ICD-10-CM | POA: Insufficient documentation

## 2021-06-25 DIAGNOSIS — O0990 Supervision of high risk pregnancy, unspecified, unspecified trimester: Secondary | ICD-10-CM

## 2021-06-25 DIAGNOSIS — Z362 Encounter for other antenatal screening follow-up: Secondary | ICD-10-CM

## 2021-06-25 DIAGNOSIS — O3432 Maternal care for cervical incompetence, second trimester: Secondary | ICD-10-CM | POA: Insufficient documentation

## 2021-06-25 DIAGNOSIS — O24319 Unspecified pre-existing diabetes mellitus in pregnancy, unspecified trimester: Secondary | ICD-10-CM

## 2021-06-25 DIAGNOSIS — O26892 Other specified pregnancy related conditions, second trimester: Secondary | ICD-10-CM | POA: Insufficient documentation

## 2021-06-25 DIAGNOSIS — O26872 Cervical shortening, second trimester: Secondary | ICD-10-CM | POA: Insufficient documentation

## 2021-06-25 DIAGNOSIS — O24419 Gestational diabetes mellitus in pregnancy, unspecified control: Secondary | ICD-10-CM | POA: Diagnosis not present

## 2021-06-25 DIAGNOSIS — O09812 Supervision of pregnancy resulting from assisted reproductive technology, second trimester: Secondary | ICD-10-CM

## 2021-06-25 DIAGNOSIS — O43122 Velamentous insertion of umbilical cord, second trimester: Secondary | ICD-10-CM | POA: Insufficient documentation

## 2021-06-25 DIAGNOSIS — Z3A21 21 weeks gestation of pregnancy: Secondary | ICD-10-CM

## 2021-06-25 LAB — GLUCOSE TOLERANCE, 2 HOURS W/ 1HR
Glucose, 1 hour: 222 mg/dL — ABNORMAL HIGH (ref 70–179)
Glucose, 2 hour: 130 mg/dL (ref 70–152)
Glucose, Fasting: 99 mg/dL — ABNORMAL HIGH (ref 70–91)

## 2021-06-25 NOTE — Progress Notes (Signed)
Maternal-Fetal Medicine  ? ?Name: Kathryn Navarro ?DOB: Sep 06, 1995 ?MRN: [id number] ?Referring Provider: Aletha Halim, MD ? ?I had the pleasure of seeing Kathryn Navarro today at the [facility]. She is G3 P0020 at 21-weeks' gestation and is here for fetal anatomy scan. ?She conceived by IVF, which she had in [place].  Patient reports she had prophylactic cerclage later in the first trimester.  She was told that the cervix was dilated. ?Early screening confirmed gestational diabetes.  Her most recent hemoglobin A1c was 6.3%.  3 years ago, the hemoglobin A1c was 6.3% ?On cell-free fetal DNA screening, the risks of fetal aneuploidies are not increased.  MSAFP screening showed low risk for open neural tube defects. ?BP today at our office is 132/77 mm Hg. ? ?Obstetric history is significant for an ectopic pregnancy, and she had laparoscopic right salpingectomy.  She had 1 early miscarriage. ?GYN history: PCOS.  No history of abnormal Pap smears or cervical surgeries.  No history of breast disease. ?Past medical history: No history of hypertension or any chronic medical conditions. ?Past surgical history: Laparoscopy with right salpingectomy. ?Medications: Metformin 500 mg once daily, prenatal vitamins. ?Allergies: No known drug allergies. ?Social history: Denies tobacco or drug or alcohol use.  Her husband is in good health. ?Family history: No history of diabetes or venous thromboembolism in the family. ? ?Ultrasound ?We performed a fetal anatomical survey.  No markers of aneuploidies or fetal structural defects are seen.  Fetal biometry is consistent with the previously established dates.  Amniotic fluid is normal and good fetal activity seen. ?We performed transvaginal ultrasound to evaluate the cervix and cerclage.  The cervix measures 2.3 2.7 cm (2.7 cm is the best measurement) and the cerclage is in place (1.2 cm from the external os). ? ?Gestational diabetes (GDM) ?I explained the diagnosis of  gestational diabetes. Patient had 2-hour GTT yesterday and just learned the diagnosis of GDM today. Her hemoglobin A1C is below 6.5% (not consistent with type 2 diabetes) and the likelihood of congenital malformation is very low. ? ?I emphasized the importance of good blood glucose control to prevent adverse fetal or neonatal outcomes.  I discussed normal parameters of blood glucose values. I encouraged her to check her blood glucose 4 times daily. ? ?Possible complications of gestational diabetes include fetal macrosomia, shoulder dystocia and birth injuries, stillbirth (in poorly controlled diabetes) and neonatal respiratory syndrome and other complications. ? ?In about 85% of cases, gestational diabetes is well controlled by diet alone.  Exercise reduces the need for insulin.  Medical treatment includes oral hypoglycemics or insulin. Patient takes metformin now (PCOS). Patient understands that if diabetes is not well controlled with metformin, insulin will be advised. ? ?I discussed ultrasound protocol in patients with gestational diabetes. ?Timing of delivery: In well-controlled diabetes on diet, patient can be delivered at 64 weeksVaginal delivery is not contraindicated. ?Type 2 diabetes develops in up to 50% of women with GDM. I recommend postpartum screening with 75-g glucose load at 6 to 12 weeks after delivery. ? ?Cerclage ?It is unclear the events surrounding the decision to perform cerclage procedure in [place].  Patient does not have symptoms of pelvic pressure or vaginal bleeding.  I reassured the couple of normal cervical length measurements. ? ?Marginal/Velamentous cord insertion ?I explained the finding of marginal/velamentous cord insertion with help of diagrams.  In most cases, it is not associated with adverse outcomes.  We will reassess and follow-up scans.  Marginal/velamentous cord insertion can be associated with  fetal growth restriction. ? ?Recommendations ?-An appointment was made for her to  return in 4 weeks for completion of fetal anatomy. ?-Fetal growth assessments every 4 weeks. ?-Weekly BPP from [redacted] weeks gestation till delivery. ?-Delivery at [redacted] weeks gestation. ?Thank you for consultation.  If you have any questions or concerns, please contact me the [facility].  Consultation including face-to-face (more than 50%) counseling 30 minutes. ? ?

## 2021-06-28 ENCOUNTER — Ambulatory Visit (INDEPENDENT_AMBULATORY_CARE_PROVIDER_SITE_OTHER): Payer: 59 | Admitting: Obstetrics & Gynecology

## 2021-06-28 ENCOUNTER — Other Ambulatory Visit: Payer: Self-pay

## 2021-06-28 VITALS — BP 119/78 | HR 109 | Wt 190.3 lb

## 2021-06-28 DIAGNOSIS — O0990 Supervision of high risk pregnancy, unspecified, unspecified trimester: Secondary | ICD-10-CM

## 2021-06-28 DIAGNOSIS — O24919 Unspecified diabetes mellitus in pregnancy, unspecified trimester: Secondary | ICD-10-CM

## 2021-06-28 DIAGNOSIS — O26872 Cervical shortening, second trimester: Secondary | ICD-10-CM

## 2021-06-28 NOTE — Progress Notes (Signed)
? ?  PRENATAL VISIT NOTE ? ?Subjective:  ?Kathryn Navarro is a 26 y.o. G3P0020 at [redacted]w[redacted]d being seen today for ongoing prenatal care.  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 ? ?Patient reports no complaints.  Contractions: Not present. Vag. Bleeding: None.  Movement: Present. Denies leaking of fluid.  ? ?The following portions of the patient's history were reviewed and updated as appropriate: allergies, current medications, past family history, past medical history, past social history, past surgical history and problem list.  ? ?Objective:  ? ?Vitals:  ? 06/28/21 1323  ?BP: 119/78  ?Pulse: (!) 109  ?Weight: 190 lb 4.8 oz (86.3 kg)  ? ? ?Fetal Status: Fetal Heart Rate (bpm): 153   Movement: Present    ? ?General:  Alert, oriented and cooperative. Patient is in no acute distress.  ?Skin: Skin is warm and dry. No rash noted.   ?Cardiovascular: Normal heart rate noted  ?Respiratory: Normal respiratory effort, no problems with respiration noted  ?Abdomen: Soft, gravid, appropriate for gestational age.  Pain/Pressure: Absent     ?Pelvic: Cervical exam deferred        ?Extremities: Normal range of motion.  Edema: None  ?Mental Status: Normal mood and affect. Normal behavior. Normal judgment and thought content.  ? ?Assessment and Plan:  ?Pregnancy: G3P0020 at [redacted]w[redacted]d ?1. Supervision of high risk pregnancy, antepartum ?FBS up to 109 but very limited data,  has f/u with nutrition soon ? ?2. Diabetes mellitus during pregnancy, antepartum, unspecified diabetes mellitus type ? ?- Ambulatory referral to Nutrition and Diabetic Education ?Cerclage in place ?Preterm labor symptoms and general obstetric precautions including but not limited to vaginal bleeding, contractions, leaking of fluid and fetal movement were reviewed in detail with the patient. ?Please refer to After Visit Summary for other counseling recommendations.  ? ?Return in about 4 weeks (around 07/26/2021). ? ?Future Appointments  ?Date Time Provider Department Center  ?07/26/2021  7:45 AM [facility] NURSE [facility]  ?07/26/2021  8:00 AM [facility] US1 [facility]  ? ? ?Scheryl Darter, MD ?

## 2021-06-28 NOTE — Progress Notes (Signed)
Patient was seen in office for her routine prenatal care. ?Referral to Nutrition diabetes was place and appointment was scheduled.  ?

## 2021-06-29 ENCOUNTER — Other Ambulatory Visit: Payer: Self-pay

## 2021-06-29 ENCOUNTER — Ambulatory Visit: Payer: Self-pay

## 2021-07-02 ENCOUNTER — Encounter: Payer: Self-pay | Admitting: *Deleted

## 2021-07-04 ENCOUNTER — Encounter: Payer: Self-pay | Admitting: Obstetrics and Gynecology

## 2021-07-04 DIAGNOSIS — O43129 Velamentous insertion of umbilical cord, unspecified trimester: Secondary | ICD-10-CM | POA: Insufficient documentation

## 2021-07-07 ENCOUNTER — Encounter (HOSPITAL_COMMUNITY): Payer: Self-pay | Admitting: Obstetrics & Gynecology

## 2021-07-07 ENCOUNTER — Encounter: Payer: Self-pay | Admitting: Obstetrics and Gynecology

## 2021-07-07 ENCOUNTER — Inpatient Hospital Stay (HOSPITAL_COMMUNITY)
Admission: AD | Admit: 2021-07-07 | Discharge: 2021-07-07 | Disposition: A | Discharge status: Home / Self Care | Payer: 59 | Attending: Obstetrics & Gynecology | Admitting: Obstetrics & Gynecology

## 2021-07-07 DIAGNOSIS — Z3492 Encounter for supervision of normal pregnancy, unspecified, second trimester: Secondary | ICD-10-CM

## 2021-07-07 DIAGNOSIS — O0990 Supervision of high risk pregnancy, unspecified, unspecified trimester: Secondary | ICD-10-CM

## 2021-07-07 DIAGNOSIS — O24112 Pre-existing diabetes mellitus, type 2, in pregnancy, second trimester: Secondary | ICD-10-CM | POA: Diagnosis not present

## 2021-07-07 DIAGNOSIS — O0912 Supervision of pregnancy with history of ectopic or molar pregnancy, second trimester: Secondary | ICD-10-CM | POA: Diagnosis not present

## 2021-07-07 DIAGNOSIS — O368190 Decreased fetal movements, unspecified trimester, not applicable or unspecified: Secondary | ICD-10-CM

## 2021-07-07 DIAGNOSIS — O09892 Supervision of other high risk pregnancies, second trimester: Secondary | ICD-10-CM | POA: Insufficient documentation

## 2021-07-07 DIAGNOSIS — Z3A22 22 weeks gestation of pregnancy: Secondary | ICD-10-CM | POA: Diagnosis not present

## 2021-07-07 NOTE — MAU Note (Signed)
Kathryn Navarro is a 26 y.o. at [redacted]w[redacted]d here in MAU reporting: about 22 wk.  Has not felt baby move since yesterday, just a little bit. Denies pain, bleeding or leaking ? ?Onset of complaint: yesterday ?Pain score: 0/10 ?Vitals:  ? 07/07/21 1345  ?BP: 128/76  ?Pulse: (!) 110  ?Resp: 18  ?Temp: 98.6 ?F (37 ?C)  ?SpO2: 100%  ?   ?FHT:147 ?Lab orders placed from triage:  none ?

## 2021-07-07 NOTE — MAU Provider Note (Signed)
?History  ?  ? ?CSN: [id number] ? ?Arrival date and time: 07/07/21 1321 ? ? Event Date/Time  ? First Provider Initiated Contact with Patient 07/07/21 1417   ?  ? ?No chief complaint on file. ? ?HPI ?Patient Kathryn Navarro is a 26 y.o. G3P0020 ? At [redacted]w[redacted]d here with complaints of decreased fetal movements since yesterday. She reports that she feels baby moving but it is "less". She denies VB, LOF, contractions, nausea, vomiting, chills, fever, SOB.  ? ? ?She is concerned because she has history of miscarriage and ectopic pregnancy. This pregnancy is a result of IVF (done in [place]).  ? ?She messaged the clinic today with concerns for decreased fetal movements. She was told to drink something cold and lie on her left side; if that does not work she can come to MAU for evaluation.  She tried drinking something sugary and cold,  but baby did not move.  ? ?She has Diabetes and reports that her sugars have been well controlled. Husband is at the bedside providing history.  ?OB History   ? ? Gravida  ?3  ? Para  ?   ? Term  ?   ? Preterm  ?   ? AB  ?2  ? Living  ?   ?  ? ? SAB  ?1  ? IAB  ?   ? Ectopic  ?1  ? Multiple  ?   ? Live Births  ?   ?   ?  ?  ? ? ?Past Medical History:  ?Diagnosis Date  ? Diabetes mellitus without complication (HCC)   ? Infertility, female   ? PCOS (polycystic ovarian syndrome)   ? ? ?Past Surgical History:  ?Procedure Laterality Date  ? DIAGNOSTIC LAPAROSCOPY WITH REMOVAL OF ECTOPIC PREGNANCY N/A 12/09/2019  ? Procedure: DIAGNOSTIC LAPAROSCOPY WITH REMOVAL OF ECTOPIC PREGNANCY;  Surgeon: Warden Fillers, MD;  Location[facility] OR;  Service: Gynecology;  Laterality: N/A;  ? LAPAROSCOPIC UNILATERAL SALPINGECTOMY Right 12/09/2019  ? Procedure: LAPAROSCOPIC RIGHT SALPINGECTOMY;  Surgeon: Warden Fillers, MD;  Location: [facility] OR;  Service: Gynecology;  Laterality: Right;  ? ? ?History reviewed. No pertinent family history. ? ?Social History  ? ?Tobacco Use  ? Smoking status: Never  ? Smokeless tobacco: Never   ?Substance Use Topics  ? Alcohol use: Not Currently  ? Drug use: Never  ? ? ?Allergies: No Known Allergies ? ?Medications Prior to Admission  ?Medication Sig Dispense Refill Last Dose  ? acetaminophen (TYLENOL) 500 MG tablet Take 1,000 mg by mouth every 6 (six) hours as needed (pain).     ? medroxyPROGESTERone (PROVERA) 10 MG tablet Take 10 mg by mouth daily. Take one tablet by mouth once daily for 5 days (Patient not taking: Reported on 06/10/2021)     ? metFORMIN (GLUCOPHAGE-XR) 500 MG 24 hr tablet Take 500 mg by mouth daily with supper. Take 1 tablet by mouth daily at dinner     ? Prenatal Vit-Fe Fumarate-FA (MULTIVITAMIN-PRENATAL) 27-0.8 MG TABS tablet Take 1 tablet by mouth daily at 12 noon.     ? ? ?Review of Systems  ?Constitutional: Negative.   ?HENT: Negative.    ?Respiratory: Negative.    ?Genitourinary: Negative.   ?Musculoskeletal: Negative.   ?Neurological: Negative.   ?Hematological: Negative.   ?Psychiatric/Behavioral: Negative.    ?Physical Exam  ? ?Blood pressure 124/77, pulse (!) 102, temperature 98.6 ?F (37 ?C), temperature source Oral, resp. rate 18, height 5\' 4"  (1.626 m), weight 85.1 kg, last  menstrual period 01/30/2021, SpO2 100 %, unknown if currently breastfeeding. ? ?Physical Exam ?Constitutional:   ?   Appearance: Normal appearance.  ?Cardiovascular:  ?   Rate and Rhythm: Normal rate.  ?Pulmonary:  ?   Effort: Pulmonary effort is normal.  ?Abdominal:  ?   General: Abdomen is flat.  ?Musculoskeletal:     ?   General: Normal range of motion.  ?Skin: ?   General: Skin is warm.  ?Neurological:  ?   General: No focal deficit present.  ?   Mental Status: She is alert.  ? ? ?MAU Course  ?Procedures ? ?MDM ?-FHR was taken by RM and by CNM in MAU (134 bpm) ?-patient reports active movements at this time ?-patient denies any other complaints ? ?Assessment and Plan  ? ?1. Supervision of high risk pregnancy, antepartum   ?2. Fetal heart tones present, second trimester   ?3. [redacted] weeks gestation of  pregnancy   ?-patient stable for discharge with plan to keep appt tomorrow for Diabetes Education ?-reassured patient of the normalcy of fetal movement variations in pregnancy; reviewed that she may not feel regular consistent movements at 22 weeks and the best the thing she can do for her baby is to manage her Diabetes well ?-return to MAU if condition worsens or changes  ? ?[name] ?07/07/2021, 2:27 PM  ?

## 2021-07-08 ENCOUNTER — Ambulatory Visit (INDEPENDENT_AMBULATORY_CARE_PROVIDER_SITE_OTHER): Payer: 59 | Admitting: Registered"

## 2021-07-08 ENCOUNTER — Other Ambulatory Visit: Payer: Self-pay

## 2021-07-08 ENCOUNTER — Other Ambulatory Visit: Payer: Self-pay | Admitting: *Deleted

## 2021-07-08 ENCOUNTER — Encounter: Payer: 59 | Attending: Obstetrics & Gynecology | Admitting: Registered"

## 2021-07-08 DIAGNOSIS — O24919 Unspecified diabetes mellitus in pregnancy, unspecified trimester: Secondary | ICD-10-CM | POA: Diagnosis not present

## 2021-07-08 DIAGNOSIS — O0990 Supervision of high risk pregnancy, unspecified, unspecified trimester: Secondary | ICD-10-CM

## 2021-07-08 DIAGNOSIS — O24319 Unspecified pre-existing diabetes mellitus in pregnancy, unspecified trimester: Secondary | ICD-10-CM

## 2021-07-08 DIAGNOSIS — Z713 Dietary counseling and surveillance: Secondary | ICD-10-CM | POA: Diagnosis not present

## 2021-07-08 DIAGNOSIS — Z3A00 Weeks of gestation of pregnancy not specified: Secondary | ICD-10-CM | POA: Insufficient documentation

## 2021-07-08 MED ORDER — ACCU-CHEK SOFTCLIX LANCETS MISC: 12 refills | Status: DC

## 2021-07-08 MED ORDER — ACCU-CHEK GUIDE W/DEVICE KIT: 1.0000 | PACK | 0 refills | Status: AC | PRN

## 2021-07-08 MED ORDER — ACCU-CHEK GUIDE VI STRP: ORAL_STRIP | 12 refills | Status: DC

## 2021-07-08 NOTE — Progress Notes (Signed)
Patient was seen for Diabetes in pregnancy self-management on 07/08/21  ?Start time 1120 and End time 1150  ? ?Estimated due date: 11/04/21; [redacted]w[redacted]d ? ?This patient is accompanied in the office by her spouse. Patient appeared to understand English but is second language and cannot speak well per her spouse, Kathryn Navarro. Pt native language is not common or easy to have interpreter Kathryn Navarro) ? ?Clinical: ?Medications: Metformin (prescribed today?) ?Medical History: Cerclage present, T2D, PCOS ?Labs: OGTT 99, 222; A1c 6.3%  ? ?Dietary and Lifestyle History: ?Pt has been walking daily since visit with MD after learning about GDM diagnosis 1 week ago. They were only interested in learning about food and the education was limited based on patient preference. ? ?Physical Activity: walking daily ?Stress: pt was stressed when didn't feel baby's movement for a few hours ?Sleep: not assessed ? ?24 hr Recall ?Only beverages assessed. ?Beverages: water, 2 c whole milk per day, vegetable juice made at home ? ?NUTRITION INTERVENTION  ?Nutrition education (E-1) on the following topics:  ? ?Initial Follow-up ? ?[x]  []  Definition of Gestational Diabetes ?[]  []  Why dietary management is important in controlling blood glucose ?[x]  []  Effects each nutrient has on blood glucose levels ?[]  []  Simple carbohydrates vs complex carbohydrates ?[]  []  Fluid intake ?[x]  []  Creating a balanced meal plan ?[x]  []  Carbohydrate counting  ?[x]  []  When to check blood glucose levels ?[x]  []  Proper blood glucose monitoring techniques ?[x]  []  Effect of stress and stress reduction techniques  ?[x]  []  Exercise effect on blood glucose levels, appropriate exercise during pregnancy ?[x]  []  Importance of limiting caffeine and abstaining from alcohol and smoking ?[]  []  Medications used for blood sugar control during pregnancy ?[]  []  Hypoglycemia and rule of 15 ?[x]  []  Postpartum self care ? ?Patient already has a meter, is testing pre breakfast and 2 hours after each  meal.  ?FBS: 93-100 mg/dL ?Postprandial:  ? ?Patient instructed to monitor glucose levels: ?FBS: 60 - ? 95 mg/dL (some clinics use 90 for cutoff) ?1 hour: ? 140 mg/dL ?2 hour: ? mg/dL ? ?Patient received handouts: ?Nutrition Diabetes and Pregnancy ?Carbohydrate Counting List ? ?Patient will be seen for follow-up as needed.  ?

## 2021-07-12 ENCOUNTER — Other Ambulatory Visit: Payer: Self-pay

## 2021-07-26 ENCOUNTER — Other Ambulatory Visit: Payer: Self-pay | Admitting: Obstetrics and Gynecology

## 2021-07-26 ENCOUNTER — Ambulatory Visit (INDEPENDENT_AMBULATORY_CARE_PROVIDER_SITE_OTHER): Payer: 59 | Admitting: Obstetrics and Gynecology

## 2021-07-26 ENCOUNTER — Other Ambulatory Visit: Payer: Self-pay | Admitting: *Deleted

## 2021-07-26 ENCOUNTER — Ambulatory Visit: Payer: 59 | Admitting: *Deleted

## 2021-07-26 ENCOUNTER — Encounter: Payer: Self-pay | Admitting: *Deleted

## 2021-07-26 ENCOUNTER — Ambulatory Visit: Payer: 59 | Attending: Obstetrics and Gynecology

## 2021-07-26 VITALS — BP 124/79 | HR 118 | Wt 192.7 lb

## 2021-07-26 VITALS — BP 128/72 | HR 108

## 2021-07-26 DIAGNOSIS — Z362 Encounter for other antenatal screening follow-up: Secondary | ICD-10-CM | POA: Diagnosis not present

## 2021-07-26 DIAGNOSIS — O43122 Velamentous insertion of umbilical cord, second trimester: Secondary | ICD-10-CM

## 2021-07-26 DIAGNOSIS — O3432 Maternal care for cervical incompetence, second trimester: Secondary | ICD-10-CM

## 2021-07-26 DIAGNOSIS — Z3A25 25 weeks gestation of pregnancy: Secondary | ICD-10-CM

## 2021-07-26 DIAGNOSIS — O280 Abnormal hematological finding on antenatal screening of mother: Secondary | ICD-10-CM

## 2021-07-26 DIAGNOSIS — O0990 Supervision of high risk pregnancy, unspecified, unspecified trimester: Secondary | ICD-10-CM

## 2021-07-26 DIAGNOSIS — O09812 Supervision of pregnancy resulting from assisted reproductive technology, second trimester: Secondary | ICD-10-CM | POA: Diagnosis not present

## 2021-07-26 DIAGNOSIS — O24419 Gestational diabetes mellitus in pregnancy, unspecified control: Secondary | ICD-10-CM

## 2021-07-26 DIAGNOSIS — O24919 Unspecified diabetes mellitus in pregnancy, unspecified trimester: Secondary | ICD-10-CM | POA: Diagnosis not present

## 2021-07-26 DIAGNOSIS — O26872 Cervical shortening, second trimester: Secondary | ICD-10-CM

## 2021-07-26 DIAGNOSIS — O24415 Gestational diabetes mellitus in pregnancy, controlled by oral hypoglycemic drugs: Secondary | ICD-10-CM

## 2021-07-26 NOTE — Progress Notes (Signed)
? ?  PRENATAL VISIT NOTE ? ?Subjective:  ?Kathryn Navarro is a 26 y.o. G3P0020 at [redacted]w[redacted]d being seen today for ongoing prenatal care.  She is currently monitored for the following issues for this high-risk pregnancy and has PCOS (polycystic ovarian syndrome); Elevated prolactin level; History of ectopic pregnancy; Supervision of high risk pregnancy, antepartum; IVF pregnancy; Transient hypertension of pregnancy in second trimester; BMI 32.0-32.9,adult; Obesity in pregnancy; Short cervical length during pregnancy in second trimester; Cervical cerclage suture present in second trimester; Vaginismus; Abnormal antenatal AFP screen; Abnormal glucose affecting pregnancy; Diabetes in pregnancy; and Velamentous insertion of umbilical cord on their problem list. ? ?Patient doing well with no acute concerns today. She reports no complaints.  Contractions: Not present. Vag. Bleeding: None.  Movement: Present. Denies leaking of fluid.  ? ?The following portions of the patient's history were reviewed and updated as appropriate: allergies, current medications, past family history, past medical history, past social history, past surgical history and problem list. Problem list updated. ? ?Objective:  ? ?Vitals:  ? 07/26/21 1323  ?BP: 124/79  ?Pulse: (!) 118  ?Weight: 192 lb 11.2 oz (87.4 kg)  ? ? ?Fetal Status: Fetal Heart Rate (bpm): 142 Fundal Height: 26 cm Movement: Present    ? ?General:  Alert, oriented and cooperative. Patient is in no acute distress.  ?Skin: Skin is warm and dry. No rash noted.   ?Cardiovascular: Normal heart rate noted  ?Respiratory: Normal respiratory effort, no problems with respiration noted  ?Abdomen: Soft, gravid, appropriate for gestational age.  Pain/Pressure: Absent     ?Pelvic: Cervical exam deferred        ?Extremities: Normal range of motion.  Edema: None  ?Mental Status:  Normal mood and affect. Normal behavior. Normal judgment and thought content.  ? ?Assessment and Plan:  ?Pregnancy: G3P0020  at [redacted]w[redacted]d ? ?1. Supervision of high risk pregnancy, antepartum ?Continue routine care ?Discussed betamethasone and vitamin K per pt request, pt advised that these interventions are not standard of care ? ?2. [redacted] weeks gestation of pregnancy ? ? ?3. 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 ?4. Cervical cerclage suture present in second trimester ?Seen on ultrasound , TVUS, no funneling ? ?5. Short cervical length during pregnancy in second trimester ?CL was 3.1 today ? ?6. IVF pregnancy ? ? ?7. Abnormal antenatal AFP screen ?Corrected report was negative ? ?8. Gestational diabetes mellitus (GDM) in second trimester controlled on oral hypoglycemic drug ?FBS: 99-107 ?PPBS: 100-145 ? ?Pt currently only takes evening metformin, FBS are intermittently elevated, will monitor, if still elevated recommend twice daily metformin ? ?Preterm labor symptoms and general obstetric precautions including but not limited to vaginal bleeding, contractions, leaking of fluid and fetal movement were reviewed in detail with the patient. ? ?Please refer to After Visit Summary for other counseling recommendations.  ? ?Return in about 2 weeks (around 08/09/2021) for [facility], in person, 3rd trim labs. ? ? ?Lynnda Shields, MD ?Faculty Attending ?[facility] [place] ?  ?

## 2021-08-09 ENCOUNTER — Other Ambulatory Visit: Payer: Self-pay

## 2021-08-09 ENCOUNTER — Other Ambulatory Visit: Payer: 59

## 2021-08-09 ENCOUNTER — Encounter: Payer: 59 | Admitting: Obstetrics and Gynecology

## 2021-08-09 DIAGNOSIS — O0990 Supervision of high risk pregnancy, unspecified, unspecified trimester: Secondary | ICD-10-CM

## 2021-08-10 NOTE — Addendum Note (Signed)
Addended by: Isabell Jarvis on: 08/10/2021 12:23 PM ? ? Modules accepted: Orders ? ?

## 2021-08-11 ENCOUNTER — Other Ambulatory Visit: Payer: 59

## 2021-08-11 ENCOUNTER — Encounter: Payer: 59 | Admitting: Family Medicine

## 2021-08-20 ENCOUNTER — Ambulatory Visit (INDEPENDENT_AMBULATORY_CARE_PROVIDER_SITE_OTHER): Payer: 59 | Admitting: Medical

## 2021-08-20 ENCOUNTER — Encounter: Payer: Self-pay | Admitting: Medical

## 2021-08-20 ENCOUNTER — Other Ambulatory Visit: Payer: 59

## 2021-08-20 VITALS — BP 135/83 | HR 108 | Wt 194.6 lb

## 2021-08-20 DIAGNOSIS — O99210 Obesity complicating pregnancy, unspecified trimester: Secondary | ICD-10-CM

## 2021-08-20 DIAGNOSIS — O0990 Supervision of high risk pregnancy, unspecified, unspecified trimester: Secondary | ICD-10-CM

## 2021-08-20 DIAGNOSIS — O43123 Velamentous insertion of umbilical cord, third trimester: Secondary | ICD-10-CM

## 2021-08-20 DIAGNOSIS — Z3A29 29 weeks gestation of pregnancy: Secondary | ICD-10-CM

## 2021-08-20 DIAGNOSIS — O24415 Gestational diabetes mellitus in pregnancy, controlled by oral hypoglycemic drugs: Secondary | ICD-10-CM

## 2021-08-20 DIAGNOSIS — O132 Gestational [pregnancy-induced] hypertension without significant proteinuria, second trimester: Secondary | ICD-10-CM

## 2021-08-20 DIAGNOSIS — O26872 Cervical shortening, second trimester: Secondary | ICD-10-CM

## 2021-08-20 DIAGNOSIS — O3432 Maternal care for cervical incompetence, second trimester: Secondary | ICD-10-CM

## 2021-08-20 MED ORDER — METFORMIN HCL ER 500 MG PO TB24: 1000.0000 mg | ORAL_TABLET | Freq: Every day | ORAL | 1 refills | Status: DC

## 2021-08-20 NOTE — Progress Notes (Signed)
? ?  PRENATAL VISIT NOTE ? ?Subjective:  ?Kathryn Navarro is a 26 y.o. G3P0020 at [redacted]w[redacted]d being seen today for ongoing prenatal care.  She is currently monitored for the following issues for this high-risk pregnancy and has PCOS (polycystic ovarian syndrome); Elevated prolactin level; History of ectopic pregnancy; Supervision of high risk pregnancy, antepartum; IVF pregnancy; Transient hypertension of pregnancy in second trimester; BMI 32.0-32.9,adult; Obesity in pregnancy; Short cervical length during pregnancy in second trimester; Cervical cerclage suture present in second trimester; Vaginismus; Abnormal antenatal AFP screen; Diabetes in pregnancy; and Velamentous insertion of umbilical cord on their problem list. ? ?Patient reports fatigue.  Contractions: Not present. Vag. Bleeding: None.  Movement: Present. Denies leaking of fluid.  ? ?The following portions of the patient's history were reviewed and updated as appropriate: allergies, current medications, past family history, past medical history, past social history, past surgical history and problem list.  ? ?Objective:  ? ?Vitals:  ? 08/20/21 0828  ?BP: 135/83  ?Pulse: (!) 108  ?Weight: 194 lb 9.6 oz (88.3 kg)  ? ? ?Fetal Status: Fetal Heart Rate (bpm): 132   Movement: Present    ? ?General:  Alert, oriented and cooperative. Patient is in no acute distress.  ?Skin: Skin is warm and dry. No rash noted.   ?Cardiovascular: Normal heart rate noted  ?Respiratory: Normal respiratory effort, no problems with respiration noted  ?Abdomen: Soft, gravid, appropriate for gestational age.  Pain/Pressure: Absent     ?Pelvic: Cervical exam deferred        ?Extremities: Normal range of motion.  Edema: None  ?Mental Status: Normal mood and affect. Normal behavior. Normal judgment and thought content.  ? ?Assessment and Plan:  ?Pregnancy: G3P0020 at [redacted]w[redacted]d ?1. Supervision of high risk pregnancy, antepartum ?- Peds list given and importance of choosing Peds discussed  ?-  Declines birth control  ? ?2. Velamentous insertion of umbilical cord in third trimester ?- Follow-up US 08/23/21 ? ?3. Gestational diabetes mellitus (GDM) in second trimester controlled on oral hypoglycemic drug ?- All fasting levels are elevated and also likely not fasting since patient has started snacking at night  ?- Discussed better low sugar, low carb snacks for night ?- Encouraged QID CBGs ?- Consulted with Dr. Dione Plover, increase Metformin at supper to 1000 mg daily  ?- Consider Dexcom for better glucose monitoring, will message Diabetes Education ?- metFORMIN (GLUCOPHAGE-XR) 500 MG 24 hr tablet; Take 2 tablets (1,000 mg total) by mouth daily with supper. Take 2 tablet by mouth daily at dinner  Dispense: 60 tablet; Refill: 1 ? ?4. Transient hypertension of pregnancy in second trimester ?- Normotensive today  ? ?5. Obesity in pregnancy ? ?6. Cervical cerclage suture present in second trimester ? ?7. Short cervical length during pregnancy in second trimester ? ?8. IVF pregnancy ? ?9. [redacted] weeks gestation of pregnancy ? ?Preterm labor symptoms and general obstetric precautions including but not limited to vaginal bleeding, contractions, leaking of fluid and fetal movement were reviewed in detail with the patient. ?Please refer to After Visit Summary for other counseling recommendations.  ? ?Return in about 2 weeks (around 09/03/2021) for [facility] MD only, In-Person. ? ?Future Appointments  ?Date Time Provider [place]  ?08/23/2021  9:30 AM [facility] NURSE [facility]  ?08/23/2021  9:45 AM [facility] US5 [facility]  ?09/03/2021  9:15 AM Luvenia Redden, PA-C [facility]  ? ? ?Kerry Hough, PA-C ? ?

## 2021-08-20 NOTE — Patient Instructions (Signed)
AREA PEDIATRIC/FAMILY PRACTICE PHYSICIANS ? ?[place] ([address]) ?[facility] ?Chambliss, MD; Eniola, MD; Hale, MD; Hensel, MD; McDiarmid, MD; McIntyer, MD; Neal, MD; Walden, MD ?[address] [place], [place] [address] ?[phone] ?Mon-Fri 8:30-12:30, 1:30-5:00 ?Providers come to see babies at [facility] ?Accepting Medicaid ?[facility] ?Limited providers who accept newborns: Koirala, MD; Morrow, MD; Wolters, MD ?[address], [place], [place] [address] ?[phone] ?Mon-Fri 8:00-5:30 ?Babies seen by providers at [facility] ?Does NOT accept Medicaid ?Please call early in hospitalization for appointment (limited availability)  ?[facility] ?Mulberry, MD ?[address] [place], [place] [address] ?[phone] ?Mon, Tue, Thur, Fri 8:30-5:00, Wed 10:00-7:00 (closed 1-2pm) ?Babies seen by [facility] providers ?Accepting Medicaid ?[facility] ?Rubin, MD ?[address], [place], [place] [address] ?[phone] ?Mon-Fri 8:30-5:00, Sat 8:30-12:00 ?Provider comes to see babies at [facility] ?Accepting Medicaid ?Must have been referred from current patients or contacted office prior to delivery ?[facility] ([facility]) ?Brown, MD; Chandler, MD; Ettefagh, MD; Grant, MD; Lester, MD; McCormick, MD; McQueen, MD; Prose, MD; Simha, MD; Stanley, MD; Stryffeler, NP; Tebben, NP ?[address], [place], [place] [address] ?[phone] ?Mon, Tue, Thur, Fri 8:30-5:30, Wed 9:30-5:30, Sat 8:30-12:30 ?Babies seen by [facility] providers ?Accepting Medicaid ?Only accepting infants of first-time parents or siblings of current patients ?Hospital discharge coordinator will make follow-up appointment ?Jack Amos ?[address], [place], [place]  [address] ?[phone]   Fax - [phone] ?[facility] ?[address], [place], [place]  [address] ?Phone - [phone]   Fax - [phone] ?Shilpa Gosrani ?[address], [place], [place]  [address] ?[phone] ? ?[place] ([address]) ?[facility] ?Bates, MD; Brassfield, MD; Cooper, Cox, MD; MD; Davis, MD; Dovico, MD; Ettefaugh, MD; Little, MD; Lowe, MD; Keiffer, MD; Melvin, MD; Sumner, MD; Williams, MD ?[address], [place], [place] [address] ?[phone] ?Mon-Fri 8:30-5:00 (extended evenings Mon-Thur as needed), Sat-Sun 10:00-1:00 ?Providers come to see babies at [facility] ?Accepting Medicaid for families of first-time babies and families with all children in the household age 3 and under. Must register with office prior to making appointment (M-F only). ?[facility] ?Henson, NP; Knapp, MD; Lalonde, MD; Tysinger, PA ?[address] [place], [place] [address] ?[phone] ?Mon-Fri 8:00-5:00 ?Babies seen by providers at [facility] ?Does NOT accept Medicaid/Commercial Insurance Only ?[facility] ([facility])  ?Artis, MD; Barnes, MD; Bratton, MD; Coccaro, MD; Lockett Gardner, MD; Kramer, MD; Marshall, MD; Netherton, MD; Poleto, MD; Skinner, MD ?[address] [place], [place] [address] ?[phone] ?Mon-Fri 8:30-5:30, Sat (Oct.-Mar.) 9:00-1:00 ?Babies seen by providers at [facility] ?Accepting Medicaid ? ?[place] ([address]) ?[facility] ?Reid, MD; Warner, MD ?[address], [place], [place] [address] ?[phone] ?Mon-Fri 8:30-5:00, Sat 8:30-12:00 ?Providers come to see babies at [facility] ?Does NOT accept Medicaid ?[facility] ?Becker, PA; Hagler, MD; Scifres, PA; Sun, MD; Swayne, MD ?[address], [place], [place] [address] ?[phone] ?Mon-Fri 8:00-5:00 ?Babies seen by providers at [facility] ?Does NOT accept Medicaid ?Only accepting babies of parents who  are patients ?Please call early in hospitalization for appointment (limited availability) ?[facility] ?Clark, MD; Frye, MD; Kelleher, MD; Mack, NP; Miller, MD; O'Keller, MD; Patterson, NP; Pudlo, MD; Puzio, MD; Thomas, MD; Tucker, MD; Twiselton, MD ?[address], [place], [place] [address] ?[phone] ?Mon-Fri 8:00-5:00, Sat 9:00-12:00 ?Providers come to see babies at [facility] ?Does NOT accept Medicaid ? ?[place] ([address]) ?[facility] ?Limited providers accepting new patients: Brake, NP; Wharton, PA ?[address], [place], [place] [address] ?[phone] ?Mon-Fri 8:00-5:00 ?Babies seen by providers at [facility] ?Does NOT accept Medicaid ?Only accepting babies of parents who are patients ?Please call early in hospitalization for appointment (limited availability) ?[facility] ?Gay, MD; Quinlan, MD ?[address] [place], [place] [address] ?[phone] (press 1 to schedule appointment) ?Mon-Fri 8:00-5:00 ?Providers come to see babies at [facility] ?Does NOT accept Medicaid ?[facility] ?Mazer, MD ?[address] [place], [place] [address] ?[phone] ?Mon-Fri 8:30-5:00 (lunch 12:30-1:00), extended hours by appointment only Wed 5:00-6:30 ?Babies seen by [facility] providers ?Accepting Medicaid ?[facility] ?Banks, MD; Jordan, MD; Koberlein, MD ?[address], [place], [place] [address] ?[phone] ?Mon-Fri 8:00-5:00 ?Babies seen by [facility] providers ?Does NOT accept Medicaid ?[facility] ?Parker, MD; Hunter, MD; Wallace, DO ?[address] [place], [place] [address] ?[phone] ?Mon-Fri 8:00-5:00 ?Babies seen by [facility] providers ?Does NOT accept Medicaid ?[facility] ?Brandon, PA; Brecken, PA; Christy, NP; Dees, MD; DeClaire, MD; DeWeese, MD; Hansen, NP; Mills, NP; Parrish, NP; Smoot, NP; Summer, MD; Vapne,  MD ?[address] [place], [place] [address] ?[phone] ?Mon-Fri 8:30-5:00, Sat 10:00-1:00 ?Providers come to see babies at [facility] ?Does NOT accept Medicaid ?Free prenatal information session Tuesdays at 4:45pm ?[facility] ?Bouska, MD; Gordon, PA; Jeffery, PA; Weber, PA ?[address] [place] [address] ?[phone] ?Mon-Fri 7:30-5:30 ?Babies seen by [facility] providers ?[facility] ?[address], [place], [place]  [address] ?[phone]   Fax - [phone] ? ?[place] ([address]) ?[facility] ?Reese, MD ?[address] [place], [place] [address] ?[phone] ?Mon-Thur 8:00-6:00 ?Providers come to see babies at [facility] ?Accepting Medicaid ?[facility] ?Anderson, NP; Badger, MD; Beal, PA; Spencer, PA ?[address] [place], [place] [address] ?[phone] ?Mon-Thur 7:30-7:30, Fri 7:30-4:30 ?Babies seen by [facility] providers ?Accepting Medicaid ?[facility] ?Agbuya, MD; Klett, NP; Romgoolam, MD ?[address], [place], [place] [address] ?[phone] ?Mon-Fri 8:30-5:00, Sat 8:30-12:00 ?Providers come to see babies at [facility] ?Accepting Medicaid ?Must have ?Meet & Greet? appointment at office prior to delivery ?[facility] ([facility]) ?McCord, MD; Wallace, MD; Wood, MD ?[address], [place], [place] [address] ?[phone] ?Mon-Wed 8:00-6:00, Thur-Fri 8:00-5:00, Sat 9:00-12:00 ?Providers come to see babies at [facility] ?Does NOT accept Medicaid ?Only accepting siblings of current patients ?[facility]  ?[address], [place], [place]  [address] ?[phone]   Fax - [phone] ?[facility] ?[address], [place], [place]  [address] ?[phone]   Fax -  [phone] ? ?[place]  ([address] & [address]) ?[facility] ?Cirigliano, DO; Matthews, DO ?[address] , [place] [address] ?[phone] ?Mon-Fri 7:00-5:00 ?Babies seen by [facility]

## 2021-08-22 LAB — HIV ANTIBODY (ROUTINE TESTING W REFLEX): HIV Screen 4th Generation wRfx: NONREACTIVE

## 2021-08-22 LAB — CBC
Hematocrit: 35.1 % (ref 34.0–46.6)
Hemoglobin: 11.6 g/dL (ref 11.1–15.9)
MCH: 26.4 pg — ABNORMAL LOW (ref 26.6–33.0)
MCHC: 33 g/dL (ref 31.5–35.7)
MCV: 80 fL (ref 79–97)
Platelets: 378 10*3/uL (ref 150–450)
RBC: 4.39 x10E6/uL (ref 3.77–5.28)
RDW: 12.7 % (ref 11.7–15.4)
WBC: 12 10*3/uL — ABNORMAL HIGH (ref 3.4–10.8)

## 2021-08-22 LAB — RPR: RPR Ser Ql: NONREACTIVE

## 2021-08-23 ENCOUNTER — Encounter (HOSPITAL_COMMUNITY): Payer: Self-pay | Admitting: Family Medicine

## 2021-08-23 ENCOUNTER — Ambulatory Visit (HOSPITAL_BASED_OUTPATIENT_CLINIC_OR_DEPARTMENT_OTHER): Payer: 59

## 2021-08-23 ENCOUNTER — Other Ambulatory Visit: Payer: Self-pay

## 2021-08-23 ENCOUNTER — Other Ambulatory Visit: Payer: Self-pay | Admitting: Obstetrics and Gynecology

## 2021-08-23 ENCOUNTER — Ambulatory Visit: Payer: 59 | Admitting: *Deleted

## 2021-08-23 ENCOUNTER — Encounter: Payer: Self-pay | Admitting: *Deleted

## 2021-08-23 ENCOUNTER — Observation Stay (HOSPITAL_COMMUNITY)
Admission: AD | Admit: 2021-08-23 | Discharge: 2021-08-24 | Disposition: A | Discharge status: Home / Self Care | Payer: 59 | Attending: Obstetrics and Gynecology | Admitting: Obstetrics and Gynecology

## 2021-08-23 VITALS — BP 130/74 | HR 114

## 2021-08-23 DIAGNOSIS — O24415 Gestational diabetes mellitus in pregnancy, controlled by oral hypoglycemic drugs: Secondary | ICD-10-CM | POA: Insufficient documentation

## 2021-08-23 DIAGNOSIS — O3433 Maternal care for cervical incompetence, third trimester: Secondary | ICD-10-CM

## 2021-08-23 DIAGNOSIS — Z7984 Long term (current) use of oral hypoglycemic drugs: Secondary | ICD-10-CM | POA: Diagnosis not present

## 2021-08-23 DIAGNOSIS — O0990 Supervision of high risk pregnancy, unspecified, unspecified trimester: Secondary | ICD-10-CM | POA: Insufficient documentation

## 2021-08-23 DIAGNOSIS — O43123 Velamentous insertion of umbilical cord, third trimester: Secondary | ICD-10-CM | POA: Diagnosis not present

## 2021-08-23 DIAGNOSIS — O4693 Antepartum hemorrhage, unspecified, third trimester: Secondary | ICD-10-CM | POA: Diagnosis not present

## 2021-08-23 DIAGNOSIS — O09813 Supervision of pregnancy resulting from assisted reproductive technology, third trimester: Secondary | ICD-10-CM | POA: Diagnosis not present

## 2021-08-23 DIAGNOSIS — Z3A29 29 weeks gestation of pregnancy: Secondary | ICD-10-CM | POA: Insufficient documentation

## 2021-08-23 DIAGNOSIS — O0933 Supervision of pregnancy with insufficient antenatal care, third trimester: Secondary | ICD-10-CM | POA: Diagnosis not present

## 2021-08-23 DIAGNOSIS — O26873 Cervical shortening, third trimester: Secondary | ICD-10-CM | POA: Insufficient documentation

## 2021-08-23 DIAGNOSIS — O43129 Velamentous insertion of umbilical cord, unspecified trimester: Secondary | ICD-10-CM | POA: Diagnosis not present

## 2021-08-23 DIAGNOSIS — O26891 Other specified pregnancy related conditions, first trimester: Secondary | ICD-10-CM

## 2021-08-23 DIAGNOSIS — O4700 False labor before 37 completed weeks of gestation, unspecified trimester: Secondary | ICD-10-CM | POA: Diagnosis present

## 2021-08-23 DIAGNOSIS — O24419 Gestational diabetes mellitus in pregnancy, unspecified control: Secondary | ICD-10-CM | POA: Insufficient documentation

## 2021-08-23 DIAGNOSIS — O26879 Cervical shortening, unspecified trimester: Secondary | ICD-10-CM | POA: Diagnosis present

## 2021-08-23 LAB — CBC WITH DIFFERENTIAL/PLATELET
Abs Immature Granulocytes: 0.18 10*3/uL — ABNORMAL HIGH (ref 0.00–0.07)
Basophils Absolute: 0.1 10*3/uL (ref 0.0–0.1)
Basophils Relative: 0 %
Eosinophils Absolute: 0.1 10*3/uL (ref 0.0–0.5)
Eosinophils Relative: 1 %
HCT: 33.8 % — ABNORMAL LOW (ref 36.0–46.0)
Hemoglobin: 11.2 g/dL — ABNORMAL LOW (ref 12.0–15.0)
Immature Granulocytes: 1 %
Lymphocytes Relative: 11 %
Lymphs Abs: 1.5 10*3/uL (ref 0.7–4.0)
MCH: 26.8 pg (ref 26.0–34.0)
MCHC: 33.1 g/dL (ref 30.0–36.0)
MCV: 80.9 fL (ref 80.0–100.0)
Monocytes Absolute: 0.4 10*3/uL (ref 0.1–1.0)
Monocytes Relative: 3 %
Neutro Abs: 11.2 10*3/uL — ABNORMAL HIGH (ref 1.7–7.7)
Neutrophils Relative %: 84 %
Platelets: 370 10*3/uL (ref 150–400)
RBC: 4.18 MIL/uL (ref 3.87–5.11)
RDW: 12.8 % (ref 11.5–15.5)
WBC: 13.4 10*3/uL — ABNORMAL HIGH (ref 4.0–10.5)
nRBC: 0 % (ref 0.0–0.2)

## 2021-08-23 LAB — WET PREP, GENITAL
Clue Cells Wet Prep HPF POC: NONE SEEN
Sperm: NONE SEEN
Trich, Wet Prep: NONE SEEN
WBC, Wet Prep HPF POC: >=10 — AB (ref <10)
Yeast Wet Prep HPF POC: NONE SEEN

## 2021-08-23 LAB — GLUCOSE, CAPILLARY
Glucose-Capillary: 174 mg/dL — ABNORMAL HIGH (ref 70–99)
Glucose-Capillary: 107 mg/dL — ABNORMAL HIGH (ref 70–99)

## 2021-08-23 LAB — TYPE AND SCREEN
ABO/RH(D): B POS
Antibody Screen: NEGATIVE

## 2021-08-23 MED ORDER — METFORMIN HCL ER 500 MG PO TB24
1000.0000 mg | ORAL_TABLET | Freq: Every day | ORAL | Status: DC
  Administered 2021-08-23: 1000 mg via ORAL
  Filled 2021-08-23 (×3): qty 2

## 2021-08-23 MED ORDER — CALCIUM CARBONATE ANTACID 500 MG PO CHEW: 2.0000 | CHEWABLE_TABLET | ORAL | Status: DC | PRN

## 2021-08-23 MED ORDER — BETAMETHASONE SOD PHOS & ACET 6 (3-3) MG/ML IJ SUSP
12.0000 mg | INTRAMUSCULAR | Status: AC
  Administered 2021-08-23 – 2021-08-24 (×2): 12 mg via INTRAMUSCULAR
  Filled 2021-08-23: qty 5

## 2021-08-23 MED ORDER — ZOLPIDEM TARTRATE 5 MG PO TABS: 5.0000 mg | ORAL_TABLET | Freq: Every evening | ORAL | Status: DC | PRN

## 2021-08-23 MED ORDER — NIFEDIPINE ER OSMOTIC RELEASE 30 MG PO TB24
30.0000 mg | ORAL_TABLET | Freq: Every day | ORAL | Status: DC
  Administered 2021-08-23 – 2021-08-24 (×2): 30 mg via ORAL
  Filled 2021-08-23 (×2): qty 1

## 2021-08-23 MED ORDER — DOCUSATE SODIUM 100 MG PO CAPS
100.0000 mg | ORAL_CAPSULE | Freq: Every day | ORAL | Status: DC
  Filled 2021-08-23: qty 1

## 2021-08-23 MED ORDER — INSULIN ASPART 100 UNIT/ML IJ SOLN
0.0000 [IU] | Freq: Three times a day (TID) | INTRAMUSCULAR | Status: DC
  Administered 2021-08-23: 4 [IU] via SUBCUTANEOUS
  Administered 2021-08-24: 3 [IU] via SUBCUTANEOUS

## 2021-08-23 MED ORDER — ACETAMINOPHEN 325 MG PO TABS: 650.0000 mg | ORAL_TABLET | ORAL | Status: DC | PRN

## 2021-08-23 MED ORDER — PRENATAL MULTIVITAMIN CH
1.0000 | ORAL_TABLET | Freq: Every day | ORAL | Status: DC
  Administered 2021-08-24: 1 via ORAL
  Filled 2021-08-23: qty 1

## 2021-08-23 NOTE — MAU Provider Note (Signed)
? ?History  ?  ? ?CSN: [id number] ? ?Arrival date and time: 08/23/21 1107 ? ? ?  ? ?Chief Complaint  ?Patient presents with  ? Vaginal Bleeding  ? ?HPI ?This is a 26-year-old G3 P0-0-2-0 at 29 weeks and 4 days who is pregnancy is complicated by diabetes, PCOS, and infertility.  She had a prophylactic cerclage placed in [place].  Her pregnancy has progressed fairly well up to this point.  She was getting ultrasound for growth today and was noted to have some minimal vaginal bleeding.  Utrasound shows minimal cervical length.  The MFM doctor attempted to do a speculum exam, but the patient did not tolerate this well.  She presents here.  She denies contractions, decreased fetal movement, leaking fluid. ? ?OB History   ? ? Gravida  ?3  ? Para  ?   ? Term  ?   ? Preterm  ?   ? AB  ?2  ? Living  ?   ?  ? ? SAB  ?1  ? IAB  ?   ? Ectopic  ?1  ? Multiple  ?   ? Live Births  ?   ?   ?  ?  ? ? ?Past Medical History:  ?Diagnosis Date  ? Diabetes mellitus without complication (HCC)   ? Infertility, female   ? PCOS (polycystic ovarian syndrome)   ? ? ?Past Surgical History:  ?Procedure Laterality Date  ? CERVICAL CERCLAGE    ? DIAGNOSTIC LAPAROSCOPY WITH REMOVAL OF ECTOPIC PREGNANCY N/A 12/09/2019  ? Procedure: DIAGNOSTIC LAPAROSCOPY WITH REMOVAL OF ECTOPIC PREGNANCY;  Surgeon: Bass, Lawrence A, MD;  Location: [facility];  Service: Gynecology;  Laterality: N/A;  ? LAPAROSCOPIC UNILATERAL SALPINGECTOMY Right 12/09/2019  ? Procedure: LAPAROSCOPIC RIGHT SALPINGECTOMY;  Surgeon: Bass, Lawrence A, MD;  Location: [facility];  Service: Gynecology;  Laterality: Right;  ? ? ?Family History  ?Problem Relation Age of Onset  ? Cancer Neg Hx   ? Diabetes Neg Hx   ? Hypertension Neg Hx   ? ? ?Social History  ? ?Tobacco Use  ? Smoking status: Never  ? Smokeless tobacco: Never  ?Vaping Use  ? Vaping Use: Never used  ?Substance Use Topics  ? Alcohol use: Not Currently  ? Drug use: Never  ? ? ?Allergies: No Known Allergies ? ?Medications Prior to Admission   ?Medication Sig Dispense Refill Last Dose  ? Accu-Chek Softclix Lancets lancets Use four times per day 100 each 12   ? acetaminophen (TYLENOL) 500 MG tablet Take 1,000 mg by mouth every 6 (six) hours as needed (pain). (Patient not taking: Reported on 07/26/2021)     ? Blood Glucose Monitoring Suppl (ACCU-CHEK GUIDE) w/Device KIT 1 Device by Does not apply route as needed. 1 kit 0   ? glucose blood (ACCU-CHEK GUIDE) test strip Use four times per day 100 each 12   ? metFORMIN (GLUCOPHAGE-XR) 500 MG 24 hr tablet Take 2 tablets (1,000 mg total) by mouth daily with supper. Take 2 tablet by mouth daily at dinner 60 tablet 1   ? Prenatal Vit-Fe Fumarate-FA (MULTIVITAMIN-PRENATAL) 27-0.8 MG TABS tablet Take 1 tablet by mouth daily at 12 noon.     ? ? ?Review of Systems ?Physical Exam  ? ?Blood pressure 126/81, pulse (!) 108, temperature 98.4 ?F (36.9 ?C), temperature source Oral, resp. rate 16, height 5' 4" (1.626 m), weight 89 kg, last menstrual period 01/30/2021, SpO2 99 %, unknown if currently breastfeeding. ? ?Physical Exam ?Vitals reviewed. Exam conducted   with a chaperone present.  ?Constitutional:   ?   Appearance: Normal appearance.  ?Cardiovascular:  ?   Rate and Rhythm: Normal rate and regular rhythm.  ?Pulmonary:  ?   Effort: Pulmonary effort is normal.  ?Abdominal:  ?   General: Abdomen is flat.  ?   Palpations: Abdomen is soft.  ?   Hernia: There is no hernia in the left inguinal area or right inguinal area.  ?Genitourinary: ?   General: Normal vulva.  ?   Labia:     ?   Right: No rash, tenderness or lesion.     ?   Left: No rash, tenderness or lesion.   ?Lymphadenopathy:  ?   Lower Body: No right inguinal adenopathy. No left inguinal adenopathy.  ?Skin: ?   General: Skin is warm and dry.  ?   Capillary Refill: Capillary refill takes less than 2 seconds.  ?Neurological:  ?   General: No focal deficit present.  ?   Mental Status: She is alert.  ?Psychiatric:     ?   Mood and Affect: Mood normal.     ?   Behavior:  Behavior normal.     ?   Thought Content: Thought content normal.  ? ? ?MAU Course  ?Procedures ?MFM US reviewed -baby is transverse head to maternal right.  AFI 16.  EFW is 85% ? ?NST:  ?Baseline: 130  ?Variability: moderate ?Accelerations: ++  ?Decelerations:  ?Contractions: none ? ?MDM ? ? ?Assessment and Plan  ? ?1. [redacted] weeks gestation of pregnancy   ?2. Short cervix affecting pregnancy   ?3. 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 Gestational diabetes mellitus (GDM) in third trimester controlled on oral hypoglycemic drug   ? ?Admit ?BMZ ?Nifedipine ? ?[place] Riesen J Madgie Dhaliwal ?08/23/2021, 12:47 PM  ?

## 2021-08-23 NOTE — H&P (Signed)
? ?History  ?  ? ?CSN: [id number] ? ?Arrival date and time: 08/23/21 1107 ? ? ?  ? ?Chief Complaint  ?Patient presents with  ? Vaginal Bleeding  ? ?HPI ?This is a 26 year old G3 P0-0-2-0 at 29 weeks and 4 days who is pregnancy is complicated by diabetes, PCOS, and infertility.  She had a prophylactic cerclage placed in [place].  Her pregnancy has progressed fairly well up to this point.  She was getting ultrasound for growth today and was noted to have some minimal vaginal bleeding.  Utrasound shows minimal cervical length.  The MFM doctor attempted to do a speculum exam, but the patient did not tolerate this well.  She presents here.  She denies contractions, decreased fetal movement, leaking fluid. ? ?OB History   ? ? Gravida  ?3  ? Para  ?   ? Term  ?   ? Preterm  ?   ? AB  ?2  ? Living  ?   ?  ? ? SAB  ?1  ? IAB  ?   ? Ectopic  ?1  ? Multiple  ?   ? Live Births  ?   ?   ?  ?  ? ? ?Past Medical History:  ?Diagnosis Date  ? Diabetes mellitus without complication ([place])   ? Infertility, female   ? PCOS (polycystic ovarian syndrome)   ? ? ?Past Surgical History:  ?Procedure Laterality Date  ? CERVICAL CERCLAGE    ? DIAGNOSTIC LAPAROSCOPY WITH REMOVAL OF ECTOPIC PREGNANCY N/A 12/09/2019  ? Procedure: DIAGNOSTIC LAPAROSCOPY WITH REMOVAL OF ECTOPIC PREGNANCY;  Surgeon: Griffin Basil, MD;  Location: [place];  Service: Gynecology;  Laterality: N/A;  ? LAPAROSCOPIC UNILATERAL SALPINGECTOMY Right 12/09/2019  ? Procedure: LAPAROSCOPIC RIGHT SALPINGECTOMY;  Surgeon: Griffin Basil, MD;  Location: [place];  Service: Gynecology;  Laterality: Right;  ? ? ?Family History  ?Problem Relation Age of Onset  ? Cancer Neg Hx   ? Diabetes Neg Hx   ? Hypertension Neg Hx   ? ? ?Social History  ? ?Tobacco Use  ? Smoking status: Never  ? Smokeless tobacco: Never  ?Vaping Use  ? Vaping Use: Never used  ?Substance Use Topics  ? Alcohol use: Not Currently  ? Drug use: Never  ? ? ?Allergies: No Known Allergies ? ?Medications Prior to Admission   ?Medication Sig Dispense Refill Last Dose  ? Accu-Chek Softclix Lancets lancets Use four times per day 100 each 12   ? acetaminophen (TYLENOL) 500 MG tablet Take 1,000 mg by mouth every 6 (six) hours as needed (pain). (Patient not taking: Reported on 07/26/2021)     ? Blood Glucose Monitoring Suppl (ACCU-CHEK GUIDE) w/Device KIT 1 Device by Does not apply route as needed. 1 kit 0   ? glucose blood (ACCU-CHEK GUIDE) test strip Use four times per day 100 each 12   ? metFORMIN (GLUCOPHAGE-XR) 500 MG 24 hr tablet Take 2 tablets (1,000 mg total) by mouth daily with supper. Take 2 tablet by mouth daily at dinner 60 tablet 1   ? Prenatal Vit-Fe Fumarate-FA (MULTIVITAMIN-PRENATAL) 27-0.8 MG TABS tablet Take 1 tablet by mouth daily at 12 noon.     ? ? ?Review of Systems ?Physical Exam  ? ?Blood pressure 126/81, pulse (!) 108, temperature 98.4 ?F (36.9 ?C), temperature source Oral, resp. rate 16, height _0  (1.626 m), weight 89 kg, last menstrual period 01/30/2021, SpO2 99 %, unknown if currently breastfeeding. ? ?Physical Exam ?Vitals reviewed. Exam conducted  with a chaperone present.  ?Constitutional:   ?   Appearance: Normal appearance.  ?Cardiovascular:  ?   Rate and Rhythm: Normal rate and regular rhythm.  ?Pulmonary:  ?   Effort: Pulmonary effort is normal.  ?Abdominal:  ?   General: Abdomen is flat.  ?   Palpations: Abdomen is soft.  ?   Hernia: There is no hernia in the left inguinal area or right inguinal area.  ?Genitourinary: ?   General: Normal vulva.  ?   Labia:     ?   Right: No rash, tenderness or lesion.     ?   Left: No rash, tenderness or lesion.   ?Lymphadenopathy:  ?   Lower Body: No right inguinal adenopathy. No left inguinal adenopathy.  ?Skin: ?   General: Skin is warm and dry.  ?   Capillary Refill: Capillary refill takes less than 2 seconds.  ?Neurological:  ?   General: No focal deficit present.  ?   Mental Status: She is alert.  ?Psychiatric:     ?   Mood and Affect: Mood normal.     ?   Behavior:  Behavior normal.     ?   Thought Content: Thought content normal.  ? ? ?MAU Course  ?Procedures ?MFM [place] reviewed -baby is transverse head to maternal right.  AFI 16.  EFW is 85% ? ?NST:  ?Baseline: 130  ?Variability: moderate ?Accelerations: ++  ?Decelerations:  ?Contractions: none ? ?MDM ? ? ?Assessment and Plan  ? ?1. [redacted] weeks gestation of pregnancy   ?2. Short cervix affecting pregnancy   ?3. 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 Gestational diabetes mellitus (GDM) in third trimester controlled on oral hypoglycemic drug   ? ?Admit ?BMZ ?Nifedipine ? ?Truett Mainland ?08/23/2021, 12:47 PM  ?

## 2021-08-23 NOTE — MAU Note (Signed)
Kathryn Navarro is a 26 y.o. at [redacted]w[redacted]d here in MAU reporting: sent over from MFM to "check her cervix". States she was having a little bit of cramping but none currently. Having a little bit of vaginal bleeding. No LOF. ? ?Onset of complaint: today ? ?Pain score: 0/10 ? ?Vitals:  ? 08/23/21 1130  ?BP: 126/81  ?Pulse: (!) 108  ?Resp: 16  ?Temp: 98.4 ?F (36.9 ?C)  ?SpO2: 99%  ?   ?FHT:+FM, EFM applied in room ? ?Lab orders placed from triage: none ? ?

## 2021-08-24 DIAGNOSIS — O4700 False labor before 37 completed weeks of gestation, unspecified trimester: Secondary | ICD-10-CM | POA: Diagnosis present

## 2021-08-24 DIAGNOSIS — O26879 Cervical shortening, unspecified trimester: Secondary | ICD-10-CM | POA: Diagnosis not present

## 2021-08-24 LAB — GC/CHLAMYDIA PROBE AMP (~~LOC~~) NOT AT ARMC
Chlamydia: NEGATIVE
Comment: NEGATIVE
Comment: NORMAL
Neisseria Gonorrhea: NEGATIVE

## 2021-08-24 LAB — GLUCOSE, CAPILLARY
Glucose-Capillary: 107 mg/dL — ABNORMAL HIGH (ref 70–99)
Glucose-Capillary: 147 mg/dL — ABNORMAL HIGH (ref 70–99)

## 2021-08-24 MED ORDER — NIFEDIPINE ER 30 MG PO TB24: 30.0000 mg | ORAL_TABLET | Freq: Every day | ORAL | 1 refills | Status: DC

## 2021-08-24 NOTE — Discharge Summary (Signed)
Antenatal Physician Discharge Summary  ?Patient ID: ?Kathryn Navarro ?MRN: [id number] ?DOB/AGE: 1996/02/04 26 y.o. ? ?Admit date: 08/23/2021 ?Discharge date: 08/24/2021 ? ?Admission Diagnoses: ?Threatened Preterm labor ? ?Discharge Diagnoses:  ?Threatened Preterm labor ? ?Prenatal Procedures: NST, BMZ ? ?Consults: None ? ?Hospital Course:  ?Kathryn Navarro is a 26 y.o. G3P0020 with IUP at [redacted]w[redacted]d admitted for monitoring and betamethasone administration.  She was seen with MFM and had noted some bleeding. Cervix was inspected by [place] and thought to be potentially dilated although CL was normal. A speculum exam has been attempted but she suffers from severe vaginismus. She was sent to the MAU for further evaluation. At the MAU she was noted to have a cervical exam of 0.5cm.  No leaking of fluid and no bleeding.  She was otherwise asymptomatic and not noted to have any contractions. She received betamethasone x 2 doses. Her tocolysis was transitioned to Procardia as a precaution but her contractions during her admission were sporadic.  She was observed, fetal heart rate monitoring remained reassuring, and she had no signs/symptoms of progressing preterm labor or other maternal-fetal concerns.  Given the lack of contractions, bleeding and her history of vaginismus, I did not recheck her cervix. She desired discharge home where she was already following strict precautions due to her history of cerclage.  ? ?She was deemed stable for discharge to home with outpatient follow up. ? ?Discharge Exam: ?Temp:  [97.8 ?F (36.6 ?C)-99 ?F (37.2 ?C)] 99 ?F (37.2 ?C) (05/09 1149) ?Pulse Rate:  [105-119] 118 (05/09 1149) ?Resp:  [16-20] 17 (05/09 1149) ?BP: (115-145)/(62-84) 129/78 (05/09 1149) ?SpO2:  [98 %-100 %] 100 % (05/09 1149) ?Physical Examination: ?CONSTITUTIONAL: Well-developed, well-nourished female in no acute distress.  ?HENT:  Normocephalic, atraumatic, External right and left ear normal.  ?EYES: Conjunctivae and EOM are  normal. Pupils are equal, round, and reactive to light. No scleral icterus.  ?NECK: Normal range of motion, supple, no masses ?SKIN: Skin is warm and dry. No rash noted. Not diaphoretic. No erythema. No pallor. ?NEUROLOGIC: Alert and oriented to person, place, and time. Normal reflexes, muscle tone coordination. No cranial nerve deficit noted. ?PSYCHIATRIC: Normal mood and affect. Normal behavior. Normal judgment and thought content. ?CARDIOVASCULAR: Normal heart rate noted, regular rhythm ?RESPIRATORY: Effort and breath sounds normal, no problems with respiration noted ?MUSCULOSKELETAL: Normal range of motion. No edema and no tenderness. 2+ distal pulses. ?ABDOMEN: Soft, nontender, nondistended, gravid. ?CERVIX:  Not re-examined - 0.5 cm by Dr. Adrian Blackwater.  ? ?Fetal monitoring: FHR: 140 bpm, Variability: moderate, Accelerations: Present, Decelerations: Absent  ?Uterine activity: sporadic contractions ? ?Significant Diagnostic Studies:  ?Results for orders placed or performed during the hospital encounter of 08/23/21 (from the past 168 hour(s))  ?Wet prep, genital  ? Collection Time: 08/23/21 12:25 PM  ? Specimen: PATH Cytology Cervicovaginal Ancillary Only  ?Result Value Ref Range  ? Yeast Wet Prep HPF POC NONE SEEN NONE SEEN  ? Trich, Wet Prep NONE SEEN NONE SEEN  ? Clue Cells Wet Prep HPF POC NONE SEEN NONE SEEN  ? WBC, Wet Prep HPF POC >=10 (A) <10  ? Sperm NONE SEEN   ?CBC with Differential/Platelet  ? Collection Time: 08/23/21  3:07 PM  ?Result Value Ref Range  ? WBC 13.4 (H) 4.0 - 10.5 K/uL  ? RBC 4.18 3.87 - 5.11 MIL/uL  ? Hemoglobin 11.2 (L) 12.0 - 15.0 g/dL  ? HCT 33.8 (L) 36.0 - 46.0 %  ? MCV 80.9 80.0 - 100.0 fL  ?  MCH 26.8 26.0 - 34.0 pg  ? MCHC 33.1 30.0 - 36.0 g/dL  ? RDW 12.8 11.5 - 15.5 %  ? Platelets 370 150 - 400 K/uL  ? nRBC 0.0 0.0 - 0.2 %  ? Neutrophils Relative % 84 %  ? Neutro Abs 11.2 (H) 1.7 - 7.7 K/uL  ? Lymphocytes Relative 11 %  ? Lymphs Abs 1.5 0.7 - 4.0 K/uL  ? Monocytes Relative 3 %  ?  Monocytes Absolute 0.4 0.1 - 1.0 K/uL  ? Eosinophils Relative 1 %  ? Eosinophils Absolute 0.1 0.0 - 0.5 K/uL  ? Basophils Relative 0 %  ? Basophils Absolute 0.1 0.0 - 0.1 K/uL  ? Immature Granulocytes 1 %  ? Abs Immature Granulocytes 0.18 (H) 0.00 - 0.07 K/uL  ?Type and screen [facility]  ? Collection Time: 08/23/21  3:07 PM  ?Result Value Ref Range  ? ABO/RH(D) B POS   ? Antibody Screen NEG   ? Sample Expiration    ?  08/26/2021,2359 ?Performed at [facility], [address], [place], [place] [address] ?  ?Glucose, capillary  ? Collection Time: 08/23/21  8:32 PM  ?Result Value Ref Range  ? Glucose-Capillary 174 (H) 70 - 99 mg/dL  ?Glucose, capillary  ? Collection Time: 08/23/21 11:09 PM  ?Result Value Ref Range  ? Glucose-Capillary 107 (H) 70 - 99 mg/dL  ?Glucose, capillary  ? Collection Time: 08/24/21  6:25 AM  ?Result Value Ref Range  ? Glucose-Capillary 107 (H) 70 - 99 mg/dL  ?Glucose, capillary  ? Collection Time: 08/24/21 10:42 AM  ?Result Value Ref Range  ? Glucose-Capillary 147 (H) 70 - 99 mg/dL  ?Results for orders placed or performed in visit on 08/20/21 (from the past 168 hour(s))  ?HIV Antibody (routine testing w rflx)  ? Collection Time: 08/20/21  8:18 AM  ?Result Value Ref Range  ? HIV Screen 4th Generation wRfx Non Reactive Non Reactive  ?CBC  ? Collection Time: 08/20/21  8:18 AM  ?Result Value Ref Range  ? WBC 12.0 (H) 3.4 - 10.8 x10E3/uL  ? RBC 4.39 3.77 - 5.28 x10E6/uL  ? Hemoglobin 11.6 11.1 - 15.9 g/dL  ? Hematocrit 35.1 34.0 - 46.6 %  ? MCV 80 79 - 97 fL  ? MCH 26.4 (L) 26.6 - 33.0 pg  ? MCHC 33.0 31.5 - 35.7 g/dL  ? RDW 12.7 11.7 - 15.4 %  ? Platelets 378 150 - 450 x10E3/uL  ?RPR  ? Collection Time: 08/20/21  8:18 AM  ?Result Value Ref Range  ? RPR Ser Ql Non Reactive Non Reactive  ? ?[place] MFM OB Transvaginal ? ?Result Date: 08/23/2021 ?----------------------------------------------------------------------  OBSTETRICS REPORT                       (Signed Final 08/23/2021  11:03 am) ---------------------------------------------------------------------- Patient Info  ID #:       [id number]                          D.O.B.:  Oct 08, 1995 (26 yrs)  Name:       Kathryn Navarro              Visit Date: 08/23/2021 08:09 am ---------------------------------------------------------------------- Performed By  Attending:        Noralee Space MD        Ref. Address:     [address]  [place], [place]                                                             [id number]  Performed By:     Eden Lathearrie Stalter BS      [facility]  Referred By:      [facility] ---------------------------------------------------------------------- Orders  #  Description                           Code        Ordered By  1  US MFM OB FOLLOW UP                   [id number]    [facility]  2  US MFM OB TRANSVAGINAL                [id number]     [facility] ----------------------------------------------------------------------  #  Order #                     Accession #                Episode #  1  [id number]                   [id number]                 [id number]  2  [id number]                   [id number]                 [id number] ---------------------------------------------------------------------- Indications  [redacted] weeks gestation of pregnancy                Z3A.29  Gestational diabetes in pregnancy,             O24.415  controlled by oral hypoglycemic drugs  (metformin)  Pregnancy resulting from assisted              33O09.819  reproductive technology  LR NIPS/AFP-Neg  Cervical cerclage suture present, third        O34.33  trimester  Velamentous insertion of umbilical cord        O43.129  Vaginal bleeding in  pregnancy, third trimester O46.93  Pelvic pain affecting pregnancy, first         O26.891  trimester ---------------------------------------------------------------------- Fetal Evaluation  Num Of Fetuses:

## 2021-09-03 ENCOUNTER — Ambulatory Visit (INDEPENDENT_AMBULATORY_CARE_PROVIDER_SITE_OTHER): Payer: 59 | Admitting: Medical

## 2021-09-03 ENCOUNTER — Encounter: Payer: Self-pay | Admitting: Medical

## 2021-09-03 ENCOUNTER — Inpatient Hospital Stay (HOSPITAL_BASED_OUTPATIENT_CLINIC_OR_DEPARTMENT_OTHER): Payer: 59

## 2021-09-03 ENCOUNTER — Encounter (HOSPITAL_COMMUNITY): Payer: Self-pay | Admitting: Obstetrics & Gynecology

## 2021-09-03 ENCOUNTER — Other Ambulatory Visit: Payer: Self-pay

## 2021-09-03 ENCOUNTER — Inpatient Hospital Stay (EMERGENCY_DEPARTMENT_HOSPITAL)
Admission: AD | Admit: 2021-09-03 | Discharge: 2021-09-03 | Disposition: A | Discharge status: Home / Self Care | Payer: 59 | Source: Home / Self Care | Attending: Obstetrics & Gynecology | Admitting: Obstetrics & Gynecology

## 2021-09-03 VITALS — BP 142/95 | HR 116 | Wt 194.4 lb

## 2021-09-03 DIAGNOSIS — O4693 Antepartum hemorrhage, unspecified, third trimester: Secondary | ICD-10-CM | POA: Insufficient documentation

## 2021-09-03 DIAGNOSIS — O09813 Supervision of pregnancy resulting from assisted reproductive technology, third trimester: Secondary | ICD-10-CM

## 2021-09-03 DIAGNOSIS — O26853 Spotting complicating pregnancy, third trimester: Secondary | ICD-10-CM | POA: Diagnosis not present

## 2021-09-03 DIAGNOSIS — O133 Gestational [pregnancy-induced] hypertension without significant proteinuria, third trimester: Secondary | ICD-10-CM | POA: Diagnosis not present

## 2021-09-03 DIAGNOSIS — O3433 Maternal care for cervical incompetence, third trimester: Secondary | ICD-10-CM

## 2021-09-03 DIAGNOSIS — O09293 Supervision of pregnancy with other poor reproductive or obstetric history, third trimester: Secondary | ICD-10-CM | POA: Insufficient documentation

## 2021-09-03 DIAGNOSIS — O42913 Preterm premature rupture of membranes, unspecified as to length of time between rupture and onset of labor, third trimester: Secondary | ICD-10-CM | POA: Diagnosis not present

## 2021-09-03 DIAGNOSIS — O0990 Supervision of high risk pregnancy, unspecified, unspecified trimester: Secondary | ICD-10-CM

## 2021-09-03 DIAGNOSIS — O99210 Obesity complicating pregnancy, unspecified trimester: Secondary | ICD-10-CM | POA: Diagnosis not present

## 2021-09-03 DIAGNOSIS — O24415 Gestational diabetes mellitus in pregnancy, controlled by oral hypoglycemic drugs: Secondary | ICD-10-CM | POA: Diagnosis not present

## 2021-09-03 DIAGNOSIS — O43123 Velamentous insertion of umbilical cord, third trimester: Secondary | ICD-10-CM

## 2021-09-03 DIAGNOSIS — N942 Vaginismus: Secondary | ICD-10-CM | POA: Diagnosis not present

## 2021-09-03 DIAGNOSIS — O26893 Other specified pregnancy related conditions, third trimester: Secondary | ICD-10-CM | POA: Insufficient documentation

## 2021-09-03 DIAGNOSIS — O26879 Cervical shortening, unspecified trimester: Secondary | ICD-10-CM | POA: Diagnosis not present

## 2021-09-03 DIAGNOSIS — Z3A31 31 weeks gestation of pregnancy: Secondary | ICD-10-CM | POA: Insufficient documentation

## 2021-09-03 DIAGNOSIS — O3432 Maternal care for cervical incompetence, second trimester: Secondary | ICD-10-CM

## 2021-09-03 DIAGNOSIS — O42113 Preterm premature rupture of membranes, onset of labor more than 24 hours following rupture, third trimester: Secondary | ICD-10-CM | POA: Diagnosis not present

## 2021-09-03 LAB — COMPREHENSIVE METABOLIC PANEL
ALT: 22 U/L (ref 0–44)
AST: 25 U/L (ref 15–41)
Albumin: 3.2 g/dL — ABNORMAL LOW (ref 3.5–5.0)
Alkaline Phosphatase: 68 U/L (ref 38–126)
Anion gap: 10 (ref 5–15)
BUN: 6 mg/dL (ref 6–20)
CO2: 22 mmol/L (ref 22–32)
Calcium: 10.2 mg/dL (ref 8.9–10.3)
Chloride: 104 mmol/L (ref 98–111)
Creatinine, Ser: 0.4 mg/dL — ABNORMAL LOW (ref 0.44–1.00)
GFR, Estimated: >60 mL/min (ref >60)
Glucose, Bld: 97 mg/dL (ref 70–99)
Potassium: 3.9 mmol/L (ref 3.5–5.1)
Sodium: 136 mmol/L (ref 135–145)
Total Bilirubin: 0.5 mg/dL (ref 0.3–1.2)
Total Protein: 6.9 g/dL (ref 6.5–8.1)

## 2021-09-03 LAB — CBC
HCT: 34.9 % — ABNORMAL LOW (ref 36.0–46.0)
Hemoglobin: 11.2 g/dL — ABNORMAL LOW (ref 12.0–15.0)
MCH: 25.6 pg — ABNORMAL LOW (ref 26.0–34.0)
MCHC: 32.1 g/dL (ref 30.0–36.0)
MCV: 79.9 fL — ABNORMAL LOW (ref 80.0–100.0)
Platelets: 385 10*3/uL (ref 150–400)
RBC: 4.37 MIL/uL (ref 3.87–5.11)
RDW: 13.1 % (ref 11.5–15.5)
WBC: 13.6 10*3/uL — ABNORMAL HIGH (ref 4.0–10.5)
nRBC: 0 % (ref 0.0–0.2)

## 2021-09-03 LAB — URINALYSIS, ROUTINE W REFLEX MICROSCOPIC
Bilirubin Urine: NEGATIVE
Glucose, UA: NEGATIVE mg/dL
Ketones, ur: NEGATIVE mg/dL
Nitrite: NEGATIVE
Protein, ur: NEGATIVE mg/dL
Specific Gravity, Urine: 1.01 (ref 1.005–1.030)
pH: 7 (ref 5.0–8.0)

## 2021-09-03 LAB — URINALYSIS, MICROSCOPIC (REFLEX)

## 2021-09-03 LAB — PROTEIN / CREATININE RATIO, URINE
Creatinine, Urine: 62.15 mg/dL
Protein Creatinine Ratio: 0.13 mg/mg{Cre} (ref 0.00–0.15)
Total Protein, Urine: 8 mg/dL

## 2021-09-03 MED ORDER — INSULIN ASPART 100 UNIT/ML IJ SOLN: 13.0000 [IU] | Freq: Two times a day (BID) | INTRAMUSCULAR | Status: DC

## 2021-09-03 MED ORDER — INSULIN STARTER KIT- SYRINGES (ENGLISH): 1.0000 | Freq: Once | Status: DC

## 2021-09-03 MED ORDER — NIFEDIPINE ER 60 MG PO TB24: 60.0000 mg | ORAL_TABLET | Freq: Every day | ORAL | 3 refills | Status: AC

## 2021-09-03 NOTE — Progress Notes (Signed)
PRENATAL VISIT NOTE  Subjective:  Kathryn Navarro is a 26 y.o. G3P0020 at [redacted]w[redacted]d being seen today for ongoing prenatal care.  She is currently monitored for the following issues for this high-risk pregnancy and has PCOS (polycystic ovarian syndrome); Elevated prolactin level; History of ectopic pregnancy; Supervision of high risk pregnancy, antepartum; IVF pregnancy; Transient hypertension of pregnancy in second trimester; BMI 32.0-32.9,adult; Obesity in pregnancy; Short cervical length during pregnancy in second trimester; Cervical cerclage suture present in second trimester; Vaginismus; Abnormal antenatal AFP screen; Diabetes in pregnancy; Velamentous insertion of umbilical cord; Short cervix affecting pregnancy; and Threatened preterm labor on their problem list.  Patient reports cramping last night, spotting today.  Contractions: Irritability. Vag. Bleeding: None.  Movement: Present. Denies leaking of fluid.   The following portions of the patient's history were reviewed and updated as appropriate: allergies, current medications, past family history, past medical history, past social history, past surgical history and problem list.   Objective:   Vitals:   09/03/21 0923 09/03/21 0925  BP: 131/90 (!) 142/95  Pulse: (!) 128 (!) 116  Weight: 194 lb 6.4 oz (88.2 kg)     Fetal Status: Fetal Heart Rate (bpm): 143   Movement: Present     General:  Alert, oriented and cooperative. Patient is in no acute distress.  Skin: Skin is warm and dry. No rash noted.   Cardiovascular: Normal heart rate noted  Respiratory: Normal respiratory effort, no problems with respiration noted  Abdomen: Soft, gravid, appropriate for gestational age.  Pain/Pressure: Present     Pelvic: Cervical exam deferred        Extremities: Normal range of motion.  Edema: None  Mental Status: Normal mood and affect. Normal behavior. Normal judgment and thought content.   Assessment and Plan:  Pregnancy: [id number] at  [redacted]w[redacted]d 1. Supervision of high risk pregnancy, antepartum - US Fetal Echocardiography; Future  2. 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 Short cervix affecting pregnancy - Has cerclage in place, last CL normal during admission   4. Gestational diabetes mellitus (GDM) in third trimester controlled on oral hypoglycemic drug - US Fetal Echocardiography; Future - Only checking CBGs once or twice a day - Consulted with Dr. Dione Plover, since all values are elevated will need to start insulin  - Recommended dose of 13 u BID of NPH/Novolog and follow-up with diabetes education in 1 week or sooner  5. Obesity in pregnancy  6. Cervical cerclage suture present in second trimester - in place  7. Vaginismus - Severe  8. [redacted] weeks gestation of pregnancy  9. Gestational hypertension, third trimester - Possibly CHTN, patient had elevated BP at 19 weeks and then multiple elevated BP toay  - Is on Procardia 30 mg daily, will increase to 60 mg daily per consult with Dr. Dione Plover - Labs were to be obtained today, but due to bleeding patient is going to MAU and will have labs there  - Patient denies HA, blurred vision, floaters, RUQ pain or edema  While waiting for visit patient went to bathroom and noted bleeding. She was advised that due to bleeding and recent cramping she should go to MAU for further evaluation. Labs would be done there and next steps determined which could include admission for observation. New Rx as noted above were sent.   Preterm labor symptoms and general obstetric precautions including but not limited to vaginal bleeding, contractions, leaking of fluid and fetal movement were reviewed in  detail with the patient. Please refer to After Visit Summary for other counseling recommendations.   Return for [facility] MD only, In-Person.  Future Appointments  Date Time Provider [place]  09/23/2021  8:15 AM Danielle Rankin [facility]     Kerry Hough, PA-C

## 2021-09-03 NOTE — MAU Note (Signed)
Kathryn Navarro is a 26 y.o. at [redacted]w[redacted]d here in MAU reporting: started bleeding this morning around 0200, states it is light and is wearing a panty liner. Had some cramping yesterday but none today. +FM. No LOF. States bleeding is watery.  Onset of complaint: today  Pain score: 0/10  Vitals:   09/03/21 1053  BP: 130/86  Pulse: (!) 113  Resp: 18  Temp: 99.1 F (37.3 C)  SpO2: 97%     FHT:150  Lab orders placed from triage: UA

## 2021-09-03 NOTE — MAU Provider Note (Signed)
History     CSN: [id number]  Arrival date and time: 09/03/21 1043   Event Date/Time   First Provider Initiated Contact with Patient 09/03/21 1129      Chief Complaint  Patient presents with   Vaginal Bleeding   HPI  [name] Kidney is a 26 y.o. year old G39P0020 female at [redacted]w[redacted]d weeks gestation who was sent to MAU from her OB appointment (**see note below) where she reported cramping yesterday, but none now and vaginal bleeding since 0200. She reports wearing a pantiliner and "there is barely anything on it." She has changed her pantiliner every time she went to the BR so she could tell if her bleeding increased. She reports the VB is "watery and almost pink in color." Her high risk pregnancy is managed by [facility]. Her pregnancy is complicated by h/o ectopic (2021), SAB (2022), IVF pregnancy, velamentous cord insertion, vaginismus, and GDM (on Metformin -- new start on insulin). Her spouse is present and contributing to the history taking.   Vonzella Nipple, PA-C reported the patient had elevated BPs in pregnancy and will need baseline PEC labs done while in MAU also.  OB History     Gravida  3   Para      Term      Preterm      AB  2   Living         SAB  1   IAB      Ectopic  1   Multiple      Live Births              Past Medical History:  Diagnosis Date   Diabetes mellitus without complication (HCC)    Infertility, female    PCOS (polycystic ovarian syndrome)     Past Surgical History:  Procedure Laterality Date   CERVICAL CERCLAGE     DIAGNOSTIC LAPAROSCOPY WITH REMOVAL OF ECTOPIC PREGNANCY N/A 12/09/2019   Procedure: DIAGNOSTIC LAPAROSCOPY WITH REMOVAL OF ECTOPIC PREGNANCY;  Surgeon: Warden Fillers, MD;  Location: [facility] OR;  Service: Gynecology;  Laterality: N/A;   LAPAROSCOPIC UNILATERAL SALPINGECTOMY Right 12/09/2019   Procedure: LAPAROSCOPIC RIGHT SALPINGECTOMY;  Surgeon: Warden Fillers, MD;  Location[facility] OR;  Service: Gynecology;   Laterality: Right;    Family History  Problem Relation Age of Onset   Cancer Neg Hx    Diabetes Neg Hx    Hypertension Neg Hx     Social History   Tobacco Use   Smoking status: Never   Smokeless tobacco: Never  Vaping Use   Vaping Use: Never used  Substance Use Topics   Alcohol use: Not Currently   Drug use: Never    Allergies: No Known Allergies  No medications prior to admission.    Review of Systems  Constitutional: Negative.   HENT: Negative.    Eyes: Negative.   Respiratory: Negative.    Cardiovascular: Negative.   Gastrointestinal: Negative.   Endocrine: Negative.   Genitourinary:  Positive for vaginal bleeding (watery and pink). Negative for pelvic pain.  Musculoskeletal: Negative.   Skin: Negative.   Allergic/Immunologic: Negative.   Neurological: Negative.   Hematological: Negative.   Psychiatric/Behavioral: Negative.    Physical Exam   Patient Vitals for the past 24 hrs:  BP Temp Temp src Pulse Resp SpO2  09/03/21 1331 (!) 147/88 -- -- (!) 119 -- 99 %  09/03/21 1230 (!) 125/94 -- -- (!) 115 -- 100 %  09/03/21 1215 127/87 -- -- (!) 119 --  99 %  09/03/21 1200 131/79 -- -- (!) 114 -- 99 %  09/03/21 1130 136/88 -- -- (!) 117 -- 100 %  09/03/21 1115 (!) 137/94 -- -- (!) 113 -- 99 %  09/03/21 1111 128/87 -- -- (!) 117 -- 98 %  09/03/21 1053 130/86 99.1 F (37.3 C) Oral (!) 113 18 97 %    Physical Exam Vitals and nursing note reviewed. Exam conducted with a chaperone present.  Constitutional:      Appearance: Normal appearance. She is normal weight.  Cardiovascular:     Rate and Rhythm: Normal rate.  Pulmonary:     Effort: Pulmonary effort is normal.  Abdominal:     Palpations: Abdomen is soft.  Genitourinary:    General: Normal vulva.     Vagina: Vaginal discharge present.     Comments: Pelvic exam: External genitalia normal, SE: vaginal walls pink and well rugated, cervix is smooth, pink, no lesions, scant amt of thin mucoid, tannish-pink  vaginal d/c , cervix visually closed with cerclage in place, Uterus is non-tender, S=D, bimanual deferred d/t increased pain with SSE. D/C seen using speculum appears normal for a patient with a cerclage. Skin:    General: Skin is warm and dry.  Neurological:     Mental Status: She is alert and oriented to person, place, and time.  Psychiatric:        Mood and Affect: Mood normal.        Behavior: Behavior normal.        Thought Content: Thought content normal.        Judgment: Judgment normal.    MAU Course  Procedures  MDM CCUA CBC CMP P/C Ratio Serial BP's  Speculum Exam MFM OB Limited U/S  Results for orders placed or performed during the hospital encounter of 09/03/21 (from the past 24 hour(s))  CBC     Status: Abnormal   Collection Time: 09/03/21 11:03 AM  Result Value Ref Range   WBC 13.6 (H) 4.0 - 10.5 K/uL   RBC 4.37 3.87 - 5.11 MIL/uL   Hemoglobin 11.2 (L) 12.0 - 15.0 g/dL   HCT 40.9 (L) 81.1 - 91.4 %   MCV 79.9 (L) 80.0 - 100.0 fL   MCH 25.6 (L) 26.0 - 34.0 pg   MCHC 32.1 30.0 - 36.0 g/dL   RDW 78.2 95.6 - 21.3 %   Platelets 385 150 - 400 K/uL   nRBC 0.0 0.0 - 0.2 %  Comprehensive metabolic panel     Status: Abnormal   Collection Time: 09/03/21 11:03 AM  Result Value Ref Range   Sodium 136 135 - 145 mmol/L   Potassium 3.9 3.5 - 5.1 mmol/L   Chloride 104 98 - 111 mmol/L   CO2 22 22 - 32 mmol/L   Glucose, Bld 97 70 - 99 mg/dL   BUN 6 6 - 20 mg/dL   Creatinine, Ser 0.86 (L) 0.44 - 1.00 mg/dL   Calcium 57.8 8.9 - 46.9 mg/dL   Total Protein 6.9 6.5 - 8.1 g/dL   Albumin 3.2 (L) 3.5 - 5.0 g/dL   AST 25 15 - 41 U/L   ALT 22 0 - 44 U/L   Alkaline Phosphatase 68 38 - 126 U/L   Total Bilirubin 0.5 0.3 - 1.2 mg/dL   GFR, Estimated >62 >95 mL/min   Anion gap 10 5 - 15  Urinalysis, Routine w reflex microscopic Urine, Clean Catch     Status: Abnormal   Collection Time: 09/03/21 11:25 AM  Result Value Ref Range   Color, Urine YELLOW YELLOW   APPearance CLEAR  CLEAR   Specific Gravity, Urine 1.010 1.005 - 1.030   pH 7.0 5.0 - 8.0   Glucose, UA NEGATIVE NEGATIVE mg/dL   Hgb urine dipstick MODERATE (A) NEGATIVE   Bilirubin Urine NEGATIVE NEGATIVE   Ketones, ur NEGATIVE NEGATIVE mg/dL   Protein, ur NEGATIVE NEGATIVE mg/dL   Nitrite NEGATIVE NEGATIVE   Leukocytes,Ua SMALL (A) NEGATIVE  Protein / creatinine ratio, urine     Status: None   Collection Time: 09/03/21 11:25 AM  Result Value Ref Range   Creatinine, Urine 62.15 mg/dL   Total Protein, Urine 8 mg/dL   Protein Creatinine Ratio 0.13 0.00 - 0.15 mg/mg[Cre]  Urinalysis, Microscopic (reflex)     Status: Abnormal   Collection Time: 09/03/21 11:25 AM  Result Value Ref Range   RBC / HPF 0-5 0 - 5 RBC/hpf   WBC, UA 11-20 0 - 5 WBC/hpf   Bacteria, UA RARE (A) NONE SEEN   Squamous Epithelial / LPF 0-5 0 - 5   Mucus PRESENT       [place] MFM OB LIMITED  Result Date: 09/03/2021 ----------------------------------------------------------------------  OBSTETRICS REPORT                       (Signed Final 09/03/2021 03:29 pm) ---------------------------------------------------------------------- Patient Info  ID #:       [id number]                          D.O.B.:  [birth year] (26 yrs)  Name:       Dagoberto Ligas Meske              Visit Date: 09/03/2021 12:34 pm ---------------------------------------------------------------------- Performed By  Attending:        Lin Landsman      Ref. Address:     [address]                    [place]                                                             [place], [place]                                                             [address]  Performed By:     Marcellina Millin       [facility]  Referred By:      [facility] ---------------------------------------------------------------------- Orders  #  Description  Code         Ordered By  1  [place] MFM OB LIMITED                     [id number]    Raelyn Mora ----------------------------------------------------------------------  #  Order #                     Accession #                Episode #  1  [id number]                   [id number]                 [id number] ---------------------------------------------------------------------- Indications  Vaginal bleeding in pregnancy, third trimester O46.93  Cervical cerclage suture present, third        O34.33  trimester  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 Cord Insertion:      Velamentous insert prev seen  Amniotic Fluid  AFI FV:      Within normal limits  AFI Sum(cm)     %Tile       Largest Pocket(cm)  13.7            44          4.7  RUQ(cm)       RLQ(cm)       LUQ(cm)        LLQ(cm)  2.5           3.4           3.1            4.7   OB History  Gravidity:    3         Term:   0        Prem:   0        SAB:   2  TOP:          0       Ectopic:  0        Living: 0  Gestational Age  LMP:           30w 6d        Date:  01/30/21                 EDD:   11/06/21  Best:          31w 1d     Det. ByMurtis Sink Transfer          EDD:   11/04/21                                      (  02/16/21)   Anatomy  Diaphragm:             Appears normal         Kidneys:                Appear normal  Stomach:               Appears normal, left   Bladder:                Appears normal                         sided  Cervix Uterus Adnexa  Cervix  Not  visualized (advanced GA >24wks). Impression  Limited exam to assess vaginal bleeding.  With a known velamentous cord insertion. Recommendations  Clinical correlation recommended. Lin Landsmanorenthian Booker, MD Electronically Signed Final Report   09/03/2021 03:29 pm   Assessment and Plan  Spotting affecting pregnancy in third trimester  - Return to MAU: If you have heavier bleeding that soaks through more that 2 pads per hour for an hour or more If you bleed so much that you feel like you might pass out or you do pass out If you have significant abdominal pain that is not improved with Tylenol 1000 mg every 8 hours as needed for pain If you develop a fever > 100.5    [redacted] weeks gestation of pregnancy - Discharge patient - [facility] - Patient verbalized an understanding of the plan of care and agrees.   Raelyn Moraolitta Alyha Marines, CNM 09/03/2021, 11:30 AM

## 2021-09-03 NOTE — Discharge Instructions (Signed)
Return to MAU: If you have heavier bleeding that soaks through more that 2 pads per hour for an hour or more If you bleed so much that you feel like you might pass out or you do pass out If you have significant abdominal pain that is not improved with Tylenol 1000 mg every 8 hours as needed for pain If you develop a fever > 100.5  

## 2021-09-03 NOTE — Progress Notes (Signed)
Pt states that Glucose log is in Phone.

## 2021-09-03 NOTE — MAU Note (Signed)
Patient unable to leave urine sample at this time. Patient will try again in 10 minutes.

## 2021-09-03 NOTE — MAU Note (Signed)
Patient signed printed AVS. Discharge instructions reviewed.

## 2021-09-04 ENCOUNTER — Encounter (HOSPITAL_COMMUNITY): Payer: Self-pay | Admitting: Obstetrics and Gynecology

## 2021-09-04 ENCOUNTER — Inpatient Hospital Stay (HOSPITAL_COMMUNITY)
Admission: AD | Admit: 2021-09-04 | Discharge: 2021-09-08 | DRG: 768 | Disposition: A | Discharge status: Home / Self Care | Payer: 59 | Attending: Obstetrics and Gynecology | Admitting: Obstetrics and Gynecology

## 2021-09-04 DIAGNOSIS — O43892 Other placental disorders, second trimester: Secondary | ICD-10-CM | POA: Diagnosis not present

## 2021-09-04 DIAGNOSIS — O24424 Gestational diabetes mellitus in childbirth, insulin controlled: Secondary | ICD-10-CM | POA: Diagnosis present

## 2021-09-04 DIAGNOSIS — D62 Acute posthemorrhagic anemia: Secondary | ICD-10-CM | POA: Diagnosis not present

## 2021-09-04 DIAGNOSIS — O99210 Obesity complicating pregnancy, unspecified trimester: Secondary | ICD-10-CM | POA: Diagnosis present

## 2021-09-04 DIAGNOSIS — O43122 Velamentous insertion of umbilical cord, second trimester: Secondary | ICD-10-CM | POA: Diagnosis not present

## 2021-09-04 DIAGNOSIS — O26872 Cervical shortening, second trimester: Secondary | ICD-10-CM | POA: Diagnosis present

## 2021-09-04 DIAGNOSIS — O3433 Maternal care for cervical incompetence, third trimester: Secondary | ICD-10-CM | POA: Diagnosis present

## 2021-09-04 DIAGNOSIS — O99214 Obesity complicating childbirth: Secondary | ICD-10-CM | POA: Diagnosis present

## 2021-09-04 DIAGNOSIS — Z4802 Encounter for removal of sutures: Secondary | ICD-10-CM | POA: Diagnosis not present

## 2021-09-04 DIAGNOSIS — O43123 Velamentous insertion of umbilical cord, third trimester: Secondary | ICD-10-CM | POA: Diagnosis not present

## 2021-09-04 DIAGNOSIS — R030 Elevated blood-pressure reading, without diagnosis of hypertension: Secondary | ICD-10-CM | POA: Diagnosis present

## 2021-09-04 DIAGNOSIS — O42113 Preterm premature rupture of membranes, onset of labor more than 24 hours following rupture, third trimester: Principal | ICD-10-CM | POA: Diagnosis present

## 2021-09-04 DIAGNOSIS — O3432 Maternal care for cervical incompetence, second trimester: Secondary | ICD-10-CM | POA: Diagnosis present

## 2021-09-04 DIAGNOSIS — Q544 Congenital chordee: Secondary | ICD-10-CM | POA: Diagnosis not present

## 2021-09-04 DIAGNOSIS — Z3A31 31 weeks gestation of pregnancy: Secondary | ICD-10-CM

## 2021-09-04 DIAGNOSIS — O165 Unspecified maternal hypertension, complicating the puerperium: Secondary | ICD-10-CM

## 2021-09-04 DIAGNOSIS — Z23 Encounter for immunization: Secondary | ICD-10-CM | POA: Diagnosis not present

## 2021-09-04 DIAGNOSIS — O43812 Placental infarction, second trimester: Secondary | ICD-10-CM | POA: Diagnosis not present

## 2021-09-04 DIAGNOSIS — O0990 Supervision of high risk pregnancy, unspecified, unspecified trimester: Principal | ICD-10-CM

## 2021-09-04 DIAGNOSIS — O3463 Maternal care for abnormality of vagina, third trimester: Secondary | ICD-10-CM | POA: Diagnosis present

## 2021-09-04 DIAGNOSIS — O9081 Anemia of the puerperium: Secondary | ICD-10-CM | POA: Diagnosis not present

## 2021-09-04 DIAGNOSIS — O42013 Preterm premature rupture of membranes, onset of labor within 24 hours of rupture, third trimester: Secondary | ICD-10-CM | POA: Diagnosis not present

## 2021-09-04 DIAGNOSIS — O24415 Gestational diabetes mellitus in pregnancy, controlled by oral hypoglycemic drugs: Secondary | ICD-10-CM | POA: Diagnosis not present

## 2021-09-04 DIAGNOSIS — Z3A00 Weeks of gestation of pregnancy not specified: Secondary | ICD-10-CM | POA: Diagnosis not present

## 2021-09-04 DIAGNOSIS — Z0542 Observation and evaluation of newborn for suspected metabolic condition ruled out: Secondary | ICD-10-CM | POA: Diagnosis not present

## 2021-09-04 DIAGNOSIS — O42913 Preterm premature rupture of membranes, unspecified as to length of time between rupture and onset of labor, third trimester: Secondary | ICD-10-CM | POA: Diagnosis present

## 2021-09-04 DIAGNOSIS — D619 Aplastic anemia, unspecified: Secondary | ICD-10-CM | POA: Diagnosis not present

## 2021-09-04 DIAGNOSIS — Q541 Hypospadias, penile: Secondary | ICD-10-CM | POA: Diagnosis not present

## 2021-09-04 DIAGNOSIS — O99893 Other specified diseases and conditions complicating puerperium: Secondary | ICD-10-CM | POA: Diagnosis present

## 2021-09-04 DIAGNOSIS — O43129 Velamentous insertion of umbilical cord, unspecified trimester: Secondary | ICD-10-CM | POA: Diagnosis present

## 2021-09-04 DIAGNOSIS — Z051 Observation and evaluation of newborn for suspected infectious condition ruled out: Secondary | ICD-10-CM | POA: Diagnosis not present

## 2021-09-04 DIAGNOSIS — O24919 Unspecified diabetes mellitus in pregnancy, unspecified trimester: Secondary | ICD-10-CM | POA: Diagnosis present

## 2021-09-04 HISTORY — DX: Gestational (pregnancy-induced) hypertension without significant proteinuria, unspecified trimester: O13.9

## 2021-09-04 HISTORY — DX: Gestational diabetes mellitus in pregnancy, unspecified control: O24.419

## 2021-09-04 LAB — CBC
HCT: 34.4 % — ABNORMAL LOW (ref 36.0–46.0)
Hemoglobin: 11 g/dL — ABNORMAL LOW (ref 12.0–15.0)
MCH: 25.9 pg — ABNORMAL LOW (ref 26.0–34.0)
MCHC: 32 g/dL (ref 30.0–36.0)
MCV: 81.1 fL (ref 80.0–100.0)
Platelets: 356 10*3/uL (ref 150–400)
RBC: 4.24 MIL/uL (ref 3.87–5.11)
RDW: 13.2 % (ref 11.5–15.5)
WBC: 14 10*3/uL — ABNORMAL HIGH (ref 4.0–10.5)
nRBC: 0 % (ref 0.0–0.2)

## 2021-09-04 LAB — TYPE AND SCREEN
ABO/RH(D): B POS
Antibody Screen: NEGATIVE

## 2021-09-04 LAB — GLUCOSE, CAPILLARY
Glucose-Capillary: 90 mg/dL (ref 70–99)
Glucose-Capillary: 96 mg/dL (ref 70–99)
Glucose-Capillary: 96 mg/dL (ref 70–99)
Glucose-Capillary: 92 mg/dL (ref 70–99)

## 2021-09-04 LAB — URINALYSIS, ROUTINE W REFLEX MICROSCOPIC
Bilirubin Urine: NEGATIVE
Glucose, UA: NEGATIVE mg/dL
Ketones, ur: 20 mg/dL — AB
Nitrite: NEGATIVE
Protein, ur: NEGATIVE mg/dL
Specific Gravity, Urine: 1.006 (ref 1.005–1.030)
pH: 6 (ref 5.0–8.0)

## 2021-09-04 LAB — COMPREHENSIVE METABOLIC PANEL
ALT: 20 U/L (ref 0–44)
AST: 26 U/L (ref 15–41)
Albumin: 3.1 g/dL — ABNORMAL LOW (ref 3.5–5.0)
Alkaline Phosphatase: 66 U/L (ref 38–126)
Anion gap: 10 (ref 5–15)
BUN: <5 mg/dL — ABNORMAL LOW (ref 6–20)
CO2: 21 mmol/L — ABNORMAL LOW (ref 22–32)
Calcium: 9.6 mg/dL (ref 8.9–10.3)
Chloride: 106 mmol/L (ref 98–111)
Creatinine, Ser: 0.43 mg/dL — ABNORMAL LOW (ref 0.44–1.00)
GFR, Estimated: >60 mL/min (ref >60)
Glucose, Bld: 104 mg/dL — ABNORMAL HIGH (ref 70–99)
Potassium: 3.8 mmol/L (ref 3.5–5.1)
Sodium: 137 mmol/L (ref 135–145)
Total Bilirubin: 0.6 mg/dL (ref 0.3–1.2)
Total Protein: 6.7 g/dL (ref 6.5–8.1)

## 2021-09-04 LAB — WET PREP, GENITAL
Sperm: NONE SEEN
Trich, Wet Prep: NONE SEEN
WBC, Wet Prep HPF POC: <10 (ref <10)
Yeast Wet Prep HPF POC: NONE SEEN

## 2021-09-04 LAB — AMNISURE RUPTURE OF MEMBRANE (ROM) NOT AT ARMC: Amnisure ROM: POSITIVE

## 2021-09-04 LAB — POCT FERN TEST: POCT Fern Test: POSITIVE

## 2021-09-04 MED ORDER — CEFADROXIL 500 MG PO CAPS
500.0000 mg | ORAL_CAPSULE | Freq: Two times a day (BID) | ORAL | Status: DC
  Administered 2021-09-04 – 2021-09-05 (×3): 500 mg via ORAL
  Filled 2021-09-04 (×3): qty 1

## 2021-09-04 MED ORDER — PRENATAL MULTIVITAMIN CH
1.0000 | ORAL_TABLET | Freq: Every day | ORAL | Status: DC
  Administered 2021-09-04 – 2021-09-05 (×2): 1 via ORAL
  Filled 2021-09-04 (×2): qty 1

## 2021-09-04 MED ORDER — CALCIUM CARBONATE ANTACID 500 MG PO CHEW: 2.0000 | CHEWABLE_TABLET | ORAL | Status: DC | PRN

## 2021-09-04 MED ORDER — INSULIN ASPART 100 UNIT/ML IJ SOLN
13.0000 [IU] | Freq: Two times a day (BID) | INTRAMUSCULAR | Status: DC
  Administered 2021-09-05: 13 [IU] via SUBCUTANEOUS

## 2021-09-04 MED ORDER — AMOXICILLIN 500 MG PO CAPS
500.0000 mg | ORAL_CAPSULE | Freq: Three times a day (TID) | ORAL | Status: DC
  Administered 2021-09-06: 500 mg via ORAL
  Filled 2021-09-04 (×2): qty 1

## 2021-09-04 MED ORDER — SODIUM CHLORIDE 0.9 % IV SOLN
2.0000 g | Freq: Four times a day (QID) | INTRAVENOUS | Status: AC
  Administered 2021-09-04 – 2021-09-06 (×8): 2 g via INTRAVENOUS
  Filled 2021-09-04 (×8): qty 2000

## 2021-09-04 MED ORDER — LACTATED RINGERS IV SOLN: INTRAVENOUS | Status: DC

## 2021-09-04 MED ORDER — ZOLPIDEM TARTRATE 5 MG PO TABS: 5.0000 mg | ORAL_TABLET | Freq: Every evening | ORAL | Status: DC | PRN

## 2021-09-04 MED ORDER — ACETAMINOPHEN 325 MG PO TABS
650.0000 mg | ORAL_TABLET | ORAL | Status: DC | PRN
  Administered 2021-09-04: 650 mg via ORAL
  Filled 2021-09-04: qty 2

## 2021-09-04 MED ORDER — BETAMETHASONE SOD PHOS & ACET 6 (3-3) MG/ML IJ SUSP: 12.0000 mg | INTRAMUSCULAR | Status: DC

## 2021-09-04 MED ORDER — DOCUSATE SODIUM 100 MG PO CAPS
100.0000 mg | ORAL_CAPSULE | Freq: Every day | ORAL | Status: DC
  Administered 2021-09-04 – 2021-09-05 (×2): 100 mg via ORAL
  Filled 2021-09-04 (×2): qty 1

## 2021-09-04 MED ORDER — AZITHROMYCIN 250 MG PO TABS
1000.0000 mg | ORAL_TABLET | Freq: Once | ORAL | Status: AC
  Administered 2021-09-04: 1000 mg via ORAL
  Filled 2021-09-04: qty 4

## 2021-09-04 MED ORDER — METFORMIN HCL ER 500 MG PO TB24
1000.0000 mg | ORAL_TABLET | Freq: Every day | ORAL | Status: DC
  Administered 2021-09-04 – 2021-09-05 (×2): 1000 mg via ORAL
  Filled 2021-09-04 (×3): qty 2

## 2021-09-04 NOTE — H&P (Signed)
History     CSN: [id number]  Arrival date and time: 09/04/21 0510   None     Chief Complaint  Patient presents with   Rupture of Membranes   HPI 26 yo F at G24P0020 who presents at 18w2dfor leaking of fluid since 0230AM. She reports she was sleeping and woke up with fluid. Clear liquid. Has had continuous leaking since then. Needing to wear pad  Feeling baby move Pad/underwear changed three times  Has cerclage around 3 months of pregnancy.  IVF pregnancy.  Of note, [facility] and received [facility] on 5/8.  OB History     Gravida  3   Para      Term      Preterm      AB  2   Living         SAB  1   IAB      Ectopic  1   Multiple      Live Births              Past Medical History:  Diagnosis Date   [place]    Infertility, female    PCOS (polycystic ovarian syndrome)     Past Surgical History:  Procedure Laterality Date   CERVICAL CERCLAGE     DIAGNOSTIC LAPAROSCOPY WITH REMOVAL OF ECTOPIC PREGNANCY N/A 12/09/2019   Procedure: DIAGNOSTIC LAPAROSCOPY WITH REMOVAL OF ECTOPIC PREGNANCY;  Surgeon: BGriffin Basil MD;  [place]  Service: Gynecology;  Laterality: N/A;   LAPAROSCOPIC UNILATERAL SALPINGECTOMY Right 12/09/2019   Procedure: LAPAROSCOPIC RIGHT SALPINGECTOMY;  Surgeon: BGriffin Basil MD;  [place]  Service: Gynecology;  Laterality: Right;    Family History  Problem Relation Age of Onset   Cancer Neg Hx    Diabetes Neg Hx    Hypertension Neg Hx     Social History   Tobacco Use   Smoking status: Never   Smokeless tobacco: Never  Vaping Use   Vaping Use: Never used  Substance Use Topics   Alcohol use: Not Currently   Drug use: Never    Allergies: No Known Allergies  Facility-Administered Medications Prior to Admission  Medication Dose Route Frequency Provider Last Rate Last Admin   insulin aspart (novoLOG)  injection 13 Units  13 Units Subcutaneous BID WLuvenia Redden PA-C       insulin starter kit- syringes (English) 1 kit  1 kit Other Once WLuvenia Redden PA-C       Medications Prior to Admission  Medication Sig Dispense Refill Last Dose   metFORMIN (GLUCOPHAGE-XR) 500 MG 24 hr tablet Take 2 tablets (1,000 mg total) by mouth daily with supper. Take 2 tablet by mouth daily at dinner 60 tablet 1 09/03/2021   NIFEdipine (ADALAT CC) 60 MG 24 hr tablet Take 1 tablet (60 mg total) by mouth daily. 30 tablet 3 09/03/2021   Prenatal Vit-Fe Fumarate-FA (MULTIVITAMIN-PRENATAL) 27-0.8 MG TABS tablet Take 1 tablet by mouth daily at 12 noon.   09/03/2021   Accu-Chek Softclix Lancets lancets Use four times per day 100 each 12    Blood Glucose Monitoring Suppl (ACCU-CHEK GUIDE) w/Device KIT 1 Device by Does not apply route as needed. 1 kit 0    glucose blood (ACCU-CHEK GUIDE) test strip Use four times per day 100 each 12     Review of Systems  Constitutional:  Negative for fever.  Gastrointestinal:  Negative for abdominal  pain.  Endocrine: Negative for polyuria.  Genitourinary:  Negative for dysuria.  Neurological:  Negative for headaches.  All other systems reviewed and are negative. Physical Exam   Blood pressure 128/74, pulse (!) 107, temperature 97.7 F (36.5 C), temperature source Oral, resp. rate 18, height '5\' 4"'  (1.626 m), weight 89.3 kg, last menstrual period 01/30/2021, unknown if currently breastfeeding.  Physical Exam Vitals and nursing note reviewed.  HENT:     Head: Normocephalic.  Cardiovascular:     Rate and Rhythm: Normal rate.  Pulmonary:     Effort: Pulmonary effort is normal.  Abdominal:     General: Abdomen is flat.     Tenderness: There is no abdominal tenderness.  Genitourinary:    Comments: Grossly ruptured. 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 Amnisure collected.  Skin:    Capillary Refill: Capillary refill takes less than 2 seconds.  Neurological:      General: No focal deficit present.     Mental Status: She is alert and oriented to person, place, and time.     Assessment and Plan  PPROM 26 yo F at G3P0020 who presents at 78w2dfor leaking of fluid since 0230AM. Grossly ruptured. Amnisure sent. Fern positive. Confirmed PPROM. Tracing without evidence of fetal distress/Cat I at this time. No contractions - [facility]. Already received betamethasone less than 4 weeks ago therefore not due for rescue dose yet. Cerclage in place. - will do latency antibiotics - GBS culture and GC/CT   2. UA concerning for UTI Will get antibiotics for latency,  3. A2GDM Patient prescribed insulin yesterday but did not get yet. Will plan for CBGs and start insulin. Titrate as appropriate

## 2021-09-04 NOTE — MAU Provider Note (Signed)
History     CSN: [id number]  Arrival date and time: 09/04/21 0510   None     Chief Complaint  Patient presents with   Rupture of Membranes   HPI 26 yo F at G4P0020 who presents at 71w2dfor leaking of fluid since 0230AM. She reports she was sleeping and woke up with fluid. Clear liquid. Has had continuous leaking since then. Needing to wear pad  Feeling baby move Pad/underwear changed three times  Has cerclage around 3 months of pregnancy.  IVF pregnancy.  Of note, [facility] and received BM[facility] on 5/8.  OB History     Gravida  3   Para      Term      Preterm      AB  2   Living         SAB  1   IAB      Ectopic  1   Multiple      Live Births              Past Medical History:  Diagnosis Date   [place]    Infertility, female    PCOS (polycystic ovarian syndrome)     Past Surgical History:  Procedure Laterality Date   CERVICAL CERCLAGE     DIAGNOSTIC LAPAROSCOPY WITH REMOVAL OF ECTOPIC PREGNANCY N/A 12/09/2019   Procedure: DIAGNOSTIC LAPAROSCOPY WITH REMOVAL OF ECTOPIC PREGNANCY;  Surgeon: BGriffin Basil MD;  [place]  Service: Gynecology;  Laterality: N/A;   LAPAROSCOPIC UNILATERAL SALPINGECTOMY Right 12/09/2019   Procedure: LAPAROSCOPIC RIGHT SALPINGECTOMY;  Surgeon: BGriffin Basil MD;  [place]  Service: Gynecology;  Laterality: Right;    Family History  Problem Relation Age of Onset   Cancer Neg Hx    Diabetes Neg Hx    Hypertension Neg Hx     Social History   Tobacco Use   Smoking status: Never   Smokeless tobacco: Never  Vaping Use   Vaping Use: Never used  Substance Use Topics   Alcohol use: Not Currently   Drug use: Never    Allergies: No Known Allergies  Facility-Administered Medications Prior to Admission  Medication Dose Route Frequency Provider Last Rate Last Admin   insulin aspart (novoLOG)  injection 13 Units  13 Units Subcutaneous BID WLuvenia Redden PA-C       insulin starter kit- syringes (English) 1 kit  1 kit Other Once WLuvenia Redden PA-C       Medications Prior to Admission  Medication Sig Dispense Refill Last Dose   metFORMIN (GLUCOPHAGE-XR) 500 MG 24 hr tablet Take 2 tablets (1,000 mg total) by mouth daily with supper. Take 2 tablet by mouth daily at dinner 60 tablet 1 09/03/2021   NIFEdipine (ADALAT CC) 60 MG 24 hr tablet Take 1 tablet (60 mg total) by mouth daily. 30 tablet 3 09/03/2021   Prenatal Vit-Fe Fumarate-FA (MULTIVITAMIN-PRENATAL) 27-0.8 MG TABS tablet Take 1 tablet by mouth daily at 12 noon.   09/03/2021   Accu-Chek Softclix Lancets lancets Use four times per day 100 each 12    Blood Glucose Monitoring Suppl (ACCU-CHEK GUIDE) w/Device KIT 1 Device by Does not apply route as needed. 1 kit 0    glucose blood (ACCU-CHEK GUIDE) test strip Use four times per day 100 each 12     Review of Systems  Constitutional:  Negative for fever.  Gastrointestinal:  Negative for abdominal  pain.  Endocrine: Negative for polyuria.  Genitourinary:  Negative for dysuria.  Neurological:  Negative for headaches.  All other systems reviewed and are negative. Physical Exam   Blood pressure 128/74, pulse (!) 107, temperature 97.7 F (36.5 C), temperature source Oral, resp. rate 18, height '5\' 4"'  (1.626 m), weight 89.3 kg, last menstrual period 01/30/2021, unknown if currently breastfeeding.  Physical Exam Vitals and nursing note reviewed.  HENT:     Head: Normocephalic.  Cardiovascular:     Rate and Rhythm: Normal rate.  Pulmonary:     Effort: Pulmonary effort is normal.  Abdominal:     General: Abdomen is flat.     Tenderness: There is no abdominal tenderness.  Genitourinary:    Comments: Grossly ruptured. 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 Amnisure collected.  Skin:    Capillary Refill: Capillary refill takes less than 2 seconds.  Neurological:      General: No focal deficit present.     Mental Status: She is alert and oriented to person, place, and time.    MAU Course  Procedures  MDM Moderate 26 yo F at G3P0020 who presents at 44w2dfor leaking of fluid since 0230AM. Grossly ruptured. Amnisure sent. Fern positive. Confirmed PPROM  Assessment and Plan  [facility]. Already received betamethasone less than 4 weeks ago therefore not due for rescue dose yet.  - will do latency antibiotics - GBS culture and GC/CT   2. UA concerning for UTI Will get antibiotics for latency   ARenard Matter5/20/2023, 7:08 AM

## 2021-09-04 NOTE — Progress Notes (Signed)
Pt informed that the ultrasound is considered a limited OB ultrasound and is not intended to be a complete ultrasound exam.  Patient also informed that the ultrasound is not being completed with the intent of assessing for fetal or placental anomalies or any pelvic abnormalities.  Explained that the purpose of today's ultrasound is to assess for  presentation.  Patient acknowledges the purpose of the exam and the limitations of the study.    Cephalic  Council Munguia, NP   

## 2021-09-04 NOTE — MAU Note (Signed)
Pt says at 0400- fluid came out after going to b-room.  No fluid coming out now  Last sex- none in May  No UC's

## 2021-09-05 DIAGNOSIS — O42113 Preterm premature rupture of membranes, onset of labor more than 24 hours following rupture, third trimester: Secondary | ICD-10-CM

## 2021-09-05 LAB — CULTURE, OB URINE: Culture: <10000 — AB

## 2021-09-05 LAB — GLUCOSE, CAPILLARY
Glucose-Capillary: 112 mg/dL — ABNORMAL HIGH (ref 70–99)
Glucose-Capillary: 97 mg/dL (ref 70–99)
Glucose-Capillary: 136 mg/dL — ABNORMAL HIGH (ref 70–99)
Glucose-Capillary: 87 mg/dL (ref 70–99)
Glucose-Capillary: 138 mg/dL — ABNORMAL HIGH (ref 70–99)

## 2021-09-05 MED ORDER — SODIUM CHLORIDE 0.9% FLUSH: 3.0000 mL | Freq: Two times a day (BID) | INTRAVENOUS | Status: DC

## 2021-09-05 NOTE — Consult Note (Addendum)
Prenatal Consult   I was asked by Despina Hidden to consult on this patient for probable preterm delivery.  I had the pleasure of meeting with Mrs. Maduro and her husband today.  She is a 26 yo F at G3P0020 following IVF and cerclage placement in [place].  She was admitted yesterday for PROM at now 31+[redacted] weeks gestation.  She received betamethasone less than 4 weeks ago when she was admitted for vaginal bleeding.  Pregnancy also complicated by diabetes for which she was recently started on insulin.  I explained that the neonatal intensive care team would be present for the delivery and outlined the likely delivery room course for this baby including routine resuscitation and NRP-guided approaches to the treatment of respiratory distress. We discussed other common problems associated with prematurity including respiratory distress syndrome/CLD, apnea, feeding issues, temperature regulation, and infection risk.  We briefly discussed IVH/PVL, ROP, and NEC and that these are complications associated with prematurity, but that by 30 weeks are uncommon.    We discussed the average length of stay but I noted that the actual LOS would depend on the severity of problems encountered and response to treatments.  We discussed visitation policies and the resources available while her child is in the hospital.  We discussed the importance of good nutrition and various methods of providing nutrition (parenteral hyperalimentation, gavage feedings and/or oral feeding). We discussed the benefits of human milk and mom plans to pump/breast feed.  They would like to consider the use of donor breast milk more prior to consenting for its use.    Thank you for involving [place] in the care of this patient. All questions were answered. A member of our team will be available should the family have additional questions.  Time for consultation approximately 20 minutes.   *This visit was performed with the patient's spouse providing some  interpretation of information as needed (Mrs. Mcelmurry understand some basic English).  They have signed the waver to provide interpretation per their preference.  I did offer the use of a medical interpretor prior to starting the consult, and also offered to obtain one later if there were any medical concepts he did not feel comfortable or able to interpret.   _____________________ Electronically Signed By: Karie Schwalbe, MD, MS Neonatologist

## 2021-09-05 NOTE — Progress Notes (Signed)
Patient ID: Kathryn LyeQwincy Ronak Connelley, female   DOB: Jun 24, 1995, 26 y.o.   MRN: [id number] FACULTY PRACTICE ANTEPARTUM(COMPREHENSIVE) NOTE  Kathryn Navarro is a 26 y.o. G3P0020 with Estimated Date of Delivery: 11/04/21   By  early ultrasound 2244w3d  who is admitted for PROM.    Fetal presentation is cephalic. Length of Stay:  1  Days  Date of admission:09/04/2021  Subjective: Some sporadic contractions, 1 or 2 per hour Good FM Patient reports the fetal movement as active. Patient reports uterine contraction  activity as sporadic. Patient reports  vaginal bleeding as scant staining. Patient describes fluid per vagina as Clear.  Vitals:  Blood pressure 118/69, pulse 100, temperature 97.9 F (36.6 C), temperature source Oral, resp. rate 16, height 5\' 4"  (1.626 m), weight 89.3 kg, last menstrual period 01/30/2021, SpO2 99 %, unknown if currently breastfeeding. Vitals:   09/04/21 1917 09/05/21 0349 09/05/21 0815 09/05/21 0816  BP: 135/80 138/80 118/69   Pulse: (!) 109 (!) 111 100   Resp: 18 18 16    Temp: 97.8 F (36.6 C) 98.5 F (36.9 C) 97.9 F (36.6 C)   TempSrc: Oral Oral Oral   SpO2: 99% 99% 99% 99%  Weight:      Height:       Physical Examination:  General appearance - alert, well appearing, and in no distress Abdomen - soft, nontender, nondistended, no masses or organomegaly Fundal Height:  size equals dates Pelvic Exam:  exaNot evaluated.. Extremities: extremities normal, atraumatic, no cyanosis or edema with DTRs 2+ bilaterally Membranes:ruptured, clear fluid  Fetal Monitoring:  Baseline: 130s bpm, Variability: Good {> 6 bpm), Accelerations: Reactive, and Decelerations: Absent   reactive  Labs:  Results for orders placed or performed during the hospital encounter of 09/04/21 (from the past 24 hour(s))  Glucose, capillary   Collection Time: 09/04/21 11:12 AM  Result Value Ref Range   Glucose-Capillary 90 70 - 99 mg/dL  Glucose, capillary   Collection Time: 09/04/21  1:58 PM   Result Value Ref Range   Glucose-Capillary 96 70 - 99 mg/dL  Glucose, capillary   Collection Time: 09/04/21  7:18 PM  Result Value Ref Range   Glucose-Capillary 92 70 - 99 mg/dL  Glucose, capillary   Collection Time: 09/05/21  9:22 AM  Result Value Ref Range   Glucose-Capillary 112 (H) 70 - 99 mg/dL   Comment 1 Notify RN     Imaging Studies:    US MFM OB LIMITED  Result Date: 09/03/2021 ----------------------------------------------------------------------  OBSTETRICS REPORT                       (Signed Final 09/03/2021 03:29 pm) ---------------------------------------------------------------------- Patient Info  ID #:       [id number]                          D.O.B.:  Apr 03, 1996 (26 yrs)  Name:       Kathryn Navarro              Visit Date: 09/03/2021 12:34 pm ---------------------------------------------------------------------- Performed By  Attending:        Lin Landsmanorenthian Booker      Ref. Address:     [address]                    [place]  [place], [place]                                                             [address]  Performed By:     Marcellina Millin       [facility]  [facility]erred By:      [facility] ---------------------------------------------------------------------- Orders  #  Description                           Code        Ordered By  1  [place] MFM OB LIMITED                     [phone]    ROLITTA DAWSON ----------------------------------------------------------------------  #  Order #                     Accession #                Episode #  1  [id number]                   [id number]                 [id number] ---------------------------------------------------------------------- Indications  Vaginal bleeding in pregnancy, third trimester O46.93  Cervical cerclage suture present, third        O34.33   trimester  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 Cord Insertion:      Velamentous insert prev seen  Amniotic Fluid  AFI FV:      Within normal limits  AFI Sum(cm)     %Tile       Largest Pocket(cm)  13.7            44          4.7  RUQ(cm)       RLQ(cm)       LUQ(cm)  LLQ(cm)  2.5           3.4           3.1            4.7 ---------------------------------------------------------------------- OB History  Gravidity:    3         Term:   0        Prem:   0        SAB:   2  TOP:          0       Ectopic:  0        Living: 0 ---------------------------------------------------------------------- Gestational Age  LMP:           30w 6d        Date:  01/30/21                 EDD:   11/06/21  Best:          31w 1d     Det. ByMurtis Sink Transfer          EDD:   11/04/21                                      (02/16/21) ---------------------------------------------------------------------- Anatomy  Diaphragm:             Appears normal         Kidneys:                Appear normal  Stomach:               Appears normal, left   Bladder:                Appears normal                         sided ---------------------------------------------------------------------- Cervix Uterus Adnexa  Cervix  Not visualized (advanced GA >24wks) ---------------------------------------------------------------------- Impression  Limited exam to assess vaginal bleeding.  With a known velamentous cord insertion.  ---------------------------------------------------------------------- Recommendations  Clinical correlation recommended. ----------------------------------------------------------------------               Lin Landsman, MD Electronically Signed Final Report   09/03/2021 03:29 pm ----------------------------------------------------------------------    Medications:  Scheduled  [START ON 09/06/2021] amoxicillin  500 mg Oral TID   cefadroxil  500 mg Oral BID   docusate sodium  100 mg Oral Daily   insulin aspart  13 Units Subcutaneous BID WC   metFORMIN  1,000 mg Oral Q supper   prenatal multivitamin  1 tablet Oral Q1200   I have reviewed the patient's current medications.  ASSESSMENT + PLAN: [id number] [redacted]w[redacted]d Estimated Date of Delivery: 11/04/21   >P/PROM  09/04/21 Cephalic Received BMZ 5/8,08/24/21 On latency antibiotics NICU consulted-->ordered and communicated with Dr Alice Rieger EFW 74% 08/23/21  >McDonald cerclage(2? in place): will need removal in OR due to vaginismus, when labors or when decides on IOL, >34 weeks.  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  Evaluating pattern on current regimen, anticipate changes   Kathryn Navarro 09/05/2021,11:02 AM

## 2021-09-06 ENCOUNTER — Encounter (HOSPITAL_COMMUNITY)
Admission: AD | Disposition: A | Discharge status: Home / Self Care | Payer: Self-pay | Source: Home / Self Care | Attending: Obstetrics and Gynecology

## 2021-09-06 ENCOUNTER — Encounter (HOSPITAL_COMMUNITY): Payer: Self-pay | Admitting: Obstetrics & Gynecology

## 2021-09-06 ENCOUNTER — Inpatient Hospital Stay (HOSPITAL_COMMUNITY): Payer: 59 | Admitting: Anesthesiology

## 2021-09-06 ENCOUNTER — Other Ambulatory Visit: Payer: Self-pay

## 2021-09-06 DIAGNOSIS — Z4802 Encounter for removal of sutures: Secondary | ICD-10-CM | POA: Diagnosis not present

## 2021-09-06 DIAGNOSIS — O42113 Preterm premature rupture of membranes, onset of labor more than 24 hours following rupture, third trimester: Secondary | ICD-10-CM | POA: Diagnosis not present

## 2021-09-06 DIAGNOSIS — O24424 Gestational diabetes mellitus in childbirth, insulin controlled: Secondary | ICD-10-CM | POA: Diagnosis not present

## 2021-09-06 DIAGNOSIS — Z3A31 31 weeks gestation of pregnancy: Secondary | ICD-10-CM | POA: Diagnosis not present

## 2021-09-06 DIAGNOSIS — O3433 Maternal care for cervical incompetence, third trimester: Secondary | ICD-10-CM

## 2021-09-06 DIAGNOSIS — O43123 Velamentous insertion of umbilical cord, third trimester: Secondary | ICD-10-CM

## 2021-09-06 DIAGNOSIS — D619 Aplastic anemia, unspecified: Secondary | ICD-10-CM | POA: Diagnosis not present

## 2021-09-06 DIAGNOSIS — O42013 Preterm premature rupture of membranes, onset of labor within 24 hours of rupture, third trimester: Secondary | ICD-10-CM | POA: Diagnosis not present

## 2021-09-06 HISTORY — PX: CERVICAL CERCLAGE: SHX1329

## 2021-09-06 LAB — GC/CHLAMYDIA PROBE AMP (~~LOC~~) NOT AT ARMC
Chlamydia: NEGATIVE
Comment: NEGATIVE
Comment: NORMAL
Neisseria Gonorrhea: NEGATIVE

## 2021-09-06 LAB — GLUCOSE, CAPILLARY
Glucose-Capillary: 104 mg/dL — ABNORMAL HIGH (ref 70–99)
Glucose-Capillary: 108 mg/dL — ABNORMAL HIGH (ref 70–99)
Glucose-Capillary: 115 mg/dL — ABNORMAL HIGH (ref 70–99)
Glucose-Capillary: 120 mg/dL — ABNORMAL HIGH (ref 70–99)
Glucose-Capillary: 136 mg/dL — ABNORMAL HIGH (ref 70–99)
Glucose-Capillary: 95 mg/dL (ref 70–99)
Glucose-Capillary: 97 mg/dL (ref 70–99)

## 2021-09-06 LAB — OB RESULTS CONSOLE GBS: GBS: NEGATIVE

## 2021-09-06 LAB — CULTURE, BETA STREP (GROUP B ONLY)

## 2021-09-06 SURGERY — CERCLAGE, CERVIX, VAGINAL APPROACH
Anesthesia: Spinal | Site: Cervix | Wound class: Clean Contaminated

## 2021-09-06 MED ORDER — LACTATED RINGERS IV SOLN: INTRAVENOUS | Status: DC

## 2021-09-06 MED ORDER — LACTATED RINGERS IV SOLN
500.0000 mL | Freq: Once | INTRAVENOUS | Status: DC
Start: 2021-09-06 — End: 2021-09-07

## 2021-09-06 MED ORDER — SOD CITRATE-CITRIC ACID 500-334 MG/5ML PO SOLN: 30.0000 mL | ORAL | Status: DC | PRN

## 2021-09-06 MED ORDER — TERBUTALINE SULFATE 1 MG/ML IJ SOLN
0.2500 mg | Freq: Once | INTRAMUSCULAR | Status: AC
  Administered 2021-09-06: 0.25 mg via SUBCUTANEOUS

## 2021-09-06 MED ORDER — FENTANYL-BUPIVACAINE-NACL 0.5-0.125-0.9 MG/250ML-% EP SOLN
12.0000 mL/h | EPIDURAL | Status: DC | PRN
  Administered 2021-09-06: 12 mL/h via EPIDURAL

## 2021-09-06 MED ORDER — ONDANSETRON HCL 4 MG/2ML IJ SOLN: 4.0000 mg | Freq: Four times a day (QID) | INTRAMUSCULAR | Status: DC | PRN

## 2021-09-06 MED ORDER — LACTATED RINGERS IV SOLN: INTRAVENOUS | Status: DC | PRN

## 2021-09-06 MED ORDER — OXYCODONE-ACETAMINOPHEN 5-325 MG PO TABS: 2.0000 | ORAL_TABLET | ORAL | Status: DC | PRN

## 2021-09-06 MED ORDER — MAGNESIUM SULFATE 40 GM/1000ML IV SOLN: 2.0000 g/h | INTRAVENOUS | Status: DC

## 2021-09-06 MED ORDER — EPHEDRINE 5 MG/ML INJ: 10.0000 mg | INTRAVENOUS | Status: DC | PRN

## 2021-09-06 MED ORDER — PHENYLEPHRINE 80 MCG/ML (10ML) SYRINGE FOR IV PUSH (FOR BLOOD PRESSURE SUPPORT)
PREFILLED_SYRINGE | INTRAVENOUS | Status: AC
  Filled 2021-09-06: qty 10

## 2021-09-06 MED ORDER — FENTANYL CITRATE (PF) 100 MCG/2ML IJ SOLN
INTRAMUSCULAR | Status: DC | PRN
  Administered 2021-09-06: 15 ug via INTRATHECAL

## 2021-09-06 MED ORDER — FENTANYL CITRATE (PF) 100 MCG/2ML IJ SOLN
INTRAMUSCULAR | Status: AC
  Filled 2021-09-06: qty 2

## 2021-09-06 MED ORDER — PHENYLEPHRINE 80 MCG/ML (10ML) SYRINGE FOR IV PUSH (FOR BLOOD PRESSURE SUPPORT): 80.0000 ug | PREFILLED_SYRINGE | INTRAVENOUS | Status: DC | PRN

## 2021-09-06 MED ORDER — TERBUTALINE SULFATE 1 MG/ML IJ SOLN
0.2500 mg | Freq: Once | INTRAMUSCULAR | Status: DC | PRN
Start: 2021-09-06 — End: 2021-09-07

## 2021-09-06 MED ORDER — TRANEXAMIC ACID-NACL 1000-0.7 MG/100ML-% IV SOLN
1000.0000 mg | Freq: Once | INTRAVENOUS | Status: AC
  Administered 2021-09-06: 1000 mg via INTRAVENOUS

## 2021-09-06 MED ORDER — TERBUTALINE SULFATE 1 MG/ML IJ SOLN
INTRAMUSCULAR | Status: AC
  Filled 2021-09-06: qty 1

## 2021-09-06 MED ORDER — LACTATED RINGERS IV SOLN
500.0000 mL | INTRAVENOUS | Status: DC | PRN
  Administered 2021-09-06 (×2): 500 mL via INTRAVENOUS

## 2021-09-06 MED ORDER — OXYTOCIN-SODIUM CHLORIDE 30-0.9 UT/500ML-% IV SOLN
2.5000 [IU]/h | INTRAVENOUS | Status: DC
  Administered 2021-09-06: 2.5 [IU]/h via INTRAVENOUS
  Filled 2021-09-06: qty 500

## 2021-09-06 MED ORDER — OXYCODONE-ACETAMINOPHEN 5-325 MG PO TABS: 1.0000 | ORAL_TABLET | ORAL | Status: DC | PRN

## 2021-09-06 MED ORDER — FENTANYL-BUPIVACAINE-NACL 0.5-0.125-0.9 MG/250ML-% EP SOLN
EPIDURAL | Status: AC
Start: 2021-09-06 — End: 2021-09-06
  Filled 2021-09-06: qty 250

## 2021-09-06 MED ORDER — NIFEDIPINE 10 MG PO CAPS
10.0000 mg | ORAL_CAPSULE | Freq: Once | ORAL | Status: AC
  Administered 2021-09-06: 10 mg via ORAL
  Filled 2021-09-06: qty 1

## 2021-09-06 MED ORDER — FENTANYL CITRATE (PF) 100 MCG/2ML IJ SOLN: 50.0000 ug | INTRAMUSCULAR | Status: DC | PRN

## 2021-09-06 MED ORDER — MAGNESIUM SULFATE BOLUS VIA INFUSION
4.0000 g | Freq: Once | INTRAVENOUS | Status: AC
  Administered 2021-09-06: 4 g via INTRAVENOUS
  Filled 2021-09-06: qty 1000

## 2021-09-06 MED ORDER — LACTATED RINGERS IV BOLUS
1000.0000 mL | Freq: Once | INTRAVENOUS | Status: AC
  Administered 2021-09-06: 1000 mL via INTRAVENOUS

## 2021-09-06 MED ORDER — LIDOCAINE HCL (PF) 1 % IJ SOLN
30.0000 mL | INTRAMUSCULAR | Status: DC | PRN
Start: 2021-09-06 — End: 2021-09-07

## 2021-09-06 MED ORDER — MISOPROSTOL 25 MCG QUARTER TABLET: 25.0000 ug | ORAL_TABLET | ORAL | Status: DC | PRN

## 2021-09-06 MED ORDER — PHENYLEPHRINE HCL (PRESSORS) 10 MG/ML IV SOLN
INTRAVENOUS | Status: DC | PRN
  Administered 2021-09-06: 160 ug via INTRAVENOUS
  Administered 2021-09-06: 240 ug via INTRAVENOUS

## 2021-09-06 MED ORDER — CHLOROPROCAINE HCL 50 MG/5ML IT SOLN
INTRATHECAL | Status: DC | PRN
  Administered 2021-09-06: 4 mL via INTRATHECAL

## 2021-09-06 MED ORDER — MAGNESIUM SULFATE 40 GM/1000ML IV SOLN
INTRAVENOUS | Status: AC
  Filled 2021-09-06: qty 1000

## 2021-09-06 MED ORDER — OXYTOCIN BOLUS FROM INFUSION
333.0000 mL | Freq: Once | INTRAVENOUS | Status: AC
  Administered 2021-09-06: 333 mL via INTRAVENOUS

## 2021-09-06 MED ORDER — OXYTOCIN-SODIUM CHLORIDE 30-0.9 UT/500ML-% IV SOLN
1.0000 m[IU]/min | INTRAVENOUS | Status: DC
  Administered 2021-09-06: 2 m[IU]/min via INTRAVENOUS

## 2021-09-06 MED ORDER — DIPHENHYDRAMINE HCL 50 MG/ML IJ SOLN: 12.5000 mg | INTRAMUSCULAR | Status: DC | PRN

## 2021-09-06 MED ORDER — LIDOCAINE HCL (PF) 1 % IJ SOLN
INTRAMUSCULAR | Status: DC | PRN
Start: 2021-09-06 — End: 2021-09-06
  Administered 2021-09-06 (×2): 4 mL via EPIDURAL

## 2021-09-06 MED ORDER — ACETAMINOPHEN 325 MG PO TABS: 650.0000 mg | ORAL_TABLET | ORAL | Status: DC | PRN

## 2021-09-06 SURGICAL SUPPLY — 16 items
CANISTER SUCT 3000ML PPV (MISCELLANEOUS) ×2 IMPLANT
CATH ROBINSON RED A/P 16FR (CATHETERS) ×2 IMPLANT
GLOVE BIO SURGEON STRL SZ7.5 (GLOVE) ×4 IMPLANT
GOWN STRL REUS W/TWL LRG LVL3 (GOWN DISPOSABLE) ×2 IMPLANT
GOWN STRL REUS W/TWL XL LVL3 (GOWN DISPOSABLE) ×2 IMPLANT
PACK VAGINAL MINOR WOMEN LF (CUSTOM PROCEDURE TRAY) ×2 IMPLANT
PAD OB MATERNITY 4.3X12.25 (PERSONAL CARE ITEMS) ×2 IMPLANT
PAD PREP 24X48 CUFFED NSTRL (MISCELLANEOUS) ×2 IMPLANT
SUT ETHIBOND 0 (SUTURE) ×2 IMPLANT
SUT MERSILENE FIBER S 5 CTX 12 (SUTURE) ×2 IMPLANT
SUT SILK 2 0 SH (SUTURE) ×2 IMPLANT
SYR BULB IRRIGATION 50ML (SYRINGE) ×2 IMPLANT
TOWEL OR 17X24 6PK STRL BLUE (TOWEL DISPOSABLE) ×4 IMPLANT
TRAY FOLEY W/BAG SLVR 14FR (SET/KITS/TRAYS/PACK) ×2 IMPLANT
TUBING NON-CON 1/4 X 20 CONN (TUBING) ×2 IMPLANT
YANKAUER SUCT BULB TIP NO VENT (SUCTIONS) ×2 IMPLANT

## 2021-09-06 NOTE — Progress Notes (Signed)
Labor Progress Note Kathryn Navarro is a 26 y.o. G3P0020 at [redacted]w[redacted]d who presented for PPROM, now in preterm labor.   S: Doing well. Comfortable after epidural. Family at bedside. Does not really feel contractions and not feeling need to push.  O:  BP 134/85   Pulse (!) 109   Temp 97.9 F (36.6 C) (Oral)   Resp 16   Ht 5\' 4"  (1.626 m)   Wt 89.3 kg   LMP 01/30/2021   SpO2 100%   BMI 33.80 kg/m   EFM: Baseline 130 bpm, moderate variability, + accels, no decels  Toco: irregular, hard to read  CVE: Dilation: 10 Dilation Complete Date: 09/06/21 Dilation Complete Time: 1557 Effacement (%): 100 Station: Plus 1 Presentation: Vertex Exam by:: Albert, MD  A&P: 26 y.o. [id number] [redacted]w[redacted]d   #Labor: Magnesium started for neuroprotection upon arrival to L&D. Small forebag ruptured with clear fluid, head noted to be anterior lip/100/0-+1 station, pushes to plus 1, but not feeling regular contractions. Will labor down for 30 minutes in [place] and start pushing again. #Pain: Epidural #FWB: Cat 1  #GBS negative  Lenoria Chime, MD 6:34 PM

## 2021-09-06 NOTE — Consult Note (Signed)
Neonatology Note:   Attendance at Delivery:    I was asked by Dr. Beard to attend this vaginal delivery at 31 4/7 weeks due to PPROM with incompetent cervix. The mother is a 26yo G3P0020 with good prenatal care complicated by IVF, PCOS, GDM on metformin and insulin, incompetent cervix, and PPROM.  Cerclage removed earlier today.  ROM 68h 38m prior to delivery, fluid clear. Infant vigorous with good spontaneous cry and tone. +60 sec DCC done.  Needed minimal bulb suctioning. Lungs clearing to ausc, good tone and pink in DR.  Apgars 8 at 1 minute, 9 at 5 minutes.  Family updated.  To NICU for management.   Kathryn Baines C. Kathryn Humphreys, MD 

## 2021-09-06 NOTE — Progress Notes (Signed)
Went to bedside to evaluate patient as RN reported that she was having painful contractions and Dr. Alysia Penna was in the OR at the time.  Patient stated she was having them primarily in her back. Per RN was palpating  about every 6-7 minutes, mild. She has been having some pink spotting but this is not new and changed from prior. Patient and her husband are very worried. Husband has been communicating with IVF doctor in [place] who has many questions about why cerclage has not been removed, what the weight of the baby is. Discussed that last growth US showed weight of almost 1.7 kg. Also discussed that goal at present is to prolong pregnancy as long as possible for fetal benefit and prognosis.   Patient given one dose of PO procardia at 0948, and after consulting with Dr. Alysia Penna we also gave one dose of [place] terbutaline at 1011. Patient also given 1L LR bolus and started on gtt at 125 cc/hr. On re-evaluation around 1025 patient reported that she felt significantly better. I asked her to remain NPO for time being, if she goes 4-6 hours without return of contractions can consider resuming diet. Will leave fluids on for another 4 hours and then time to d/c.   Venora Maples, MD/MPH Attending Family Medicine Physician, [facility], [facility]  10:35 AM 09/06/21

## 2021-09-06 NOTE — Anesthesia Procedure Notes (Signed)
Epidural Patient location during procedure: OB Start time: 09/06/2021 5:04 PM End time: 09/06/2021 5:07 PM  Staffing Anesthesiologist: Brennan Bailey, MD Performed: anesthesiologist   Preanesthetic Checklist Completed: patient identified, IV checked, risks and benefits discussed, monitors and equipment checked, pre-op evaluation and timeout performed  Epidural Patient position: sitting Prep: DuraPrep and site prepped and draped Patient monitoring: continuous pulse ox, blood pressure and heart rate Approach: midline Location: L3-L4 Injection technique: LOR air  Needle:  Needle type: Tuohy  Needle gauge: 17 G Needle length: 9 cm Needle insertion depth: 5 cm Catheter type: closed end flexible Catheter size: 19 Gauge Catheter at skin depth: 10 cm Test dose: negative and Other (1% lidocaine)  Assessment Events: blood not aspirated, injection not painful, no injection resistance, no paresthesia and negative IV test  Additional Notes Patient identified. Risks, benefits, and alternatives discussed with patient including but not limited to bleeding, infection, nerve damage, paralysis, failed block, incomplete pain control, headache, blood pressure changes, nausea, vomiting, reactions to medication, itching, and postpartum back pain. Confirmed with bedside nurse the patient's most recent platelet count. Confirmed with patient that they are not currently taking any anticoagulation, have any bleeding history, or any family history of bleeding disorders. Patient expressed understanding and wished to proceed. All questions were answered. Sterile technique was used throughout the entire procedure. Please see nursing notes for vital signs.   Crisp LOR on first pass. Test dose was given through epidural catheter and negative prior to continuing to dose epidural or start infusion. Warning signs of high block given to the patient including shortness of breath, tingling/numbness in hands, complete  motor block, or any concerning symptoms with instructions to call for help. Patient was given instructions on fall risk and not to get out of bed. All questions and concerns addressed with instructions to call with any issues or inadequate analgesia.  Reason for block:procedure for pain

## 2021-09-06 NOTE — Anesthesia Preprocedure Evaluation (Addendum)
Anesthesia Evaluation  Patient identified by MRN, date of birth, ID band Patient awake    Reviewed: Allergy & Precautions, NPO status , Patient's Chart, lab work & pertinent test results  History of Anesthesia Complications Negative for: history of anesthetic complications  Airway Mallampati: II  TM Distance: >3 FB Neck ROM: Full    Dental no notable dental hx.    Pulmonary neg pulmonary ROS,    Pulmonary exam normal        Cardiovascular hypertension, Normal cardiovascular exam     Neuro/Psych negative neurological ROS  negative psych ROS   GI/Hepatic negative GI ROS, Neg liver ROS,   Endo/Other  diabetes, Gestational  Renal/GU negative Renal ROS  negative genitourinary   Musculoskeletal negative musculoskeletal ROS (+)   Abdominal   Peds  Hematology negative hematology ROS (+)   Anesthesia Other Findings Day of surgery medications reviewed with patient.  Reproductive/Obstetrics (+) Pregnancy                            Anesthesia Physical Anesthesia Plan  ASA: 2  Anesthesia Plan: Spinal   Post-op Pain Management:    Induction:   PONV Risk Score and Plan: Treatment may vary due to age or medical condition  Airway Management Planned: Natural Airway  Additional Equipment:   Intra-op Plan:   Post-operative Plan:   Informed Consent: I have reviewed the patients History and Physical, chart, labs and discussed the procedure including the risks, benefits and alternatives for the proposed anesthesia with the patient or authorized representative who has indicated his/her understanding and acceptance.     Interpreter used for [employer] Discussed with: CRNA  Anesthesia Plan Comments: (Consent obtained with patient and her husband present in preop (husband is her designated interpreter). All questions answered. Stephannie Peters, MD)       Anesthesia Quick Evaluation

## 2021-09-06 NOTE — Anesthesia Preprocedure Evaluation (Signed)
Anesthesia Evaluation  Patient identified by MRN, date of birth, ID band Patient awake    Reviewed: Allergy & Precautions, Patient's Chart, lab work & pertinent test results  History of Anesthesia Complications Negative for: history of anesthetic complications  Airway Mallampati: II  TM Distance: >3 FB Neck ROM: Full    Dental no notable dental hx.    Pulmonary neg pulmonary ROS,    Pulmonary exam normal        Cardiovascular negative cardio ROS Normal cardiovascular exam     Neuro/Psych negative neurological ROS  negative psych ROS   GI/Hepatic negative GI ROS, Neg liver ROS,   Endo/Other  diabetes, Gestational  Renal/GU negative Renal ROS  negative genitourinary   Musculoskeletal negative musculoskeletal ROS (+)   Abdominal   Peds  Hematology negative hematology ROS (+)   Anesthesia Other Findings Day of surgery medications reviewed with patient.  Reproductive/Obstetrics (+) Pregnancy                             Anesthesia Physical Anesthesia Plan  ASA: 2  Anesthesia Plan: Epidural   Post-op Pain Management:    Induction:   PONV Risk Score and Plan: Treatment may vary due to age or medical condition  Airway Management Planned: Natural Airway  Additional Equipment: Fetal Monitoring  Intra-op Plan:   Post-operative Plan:   Informed Consent: I have reviewed the patients History and Physical, chart, labs and discussed the procedure including the risks, benefits and alternatives for the proposed anesthesia with the patient or authorized representative who has indicated his/her understanding and acceptance.     Interpreter used for [employer] Discussed with:   Anesthesia Plan Comments: (Husband is official interpreter and was present in labor room for procedure. Stephannie Peters, MD)        Anesthesia Quick Evaluation

## 2021-09-06 NOTE — Progress Notes (Signed)
Labor Progress Note Kathryn Navarro is a 25 y.o. G3P0020 at [redacted]w[redacted]d presented for PPROM, now in pre-term labor.   S: Doing well. Not feeling much pressure or contractions with epidural in place.   O:  BP 122/79   Pulse (!) 112   Temp 97.9 F (36.6 C) (Oral)   Resp 16   Ht 5\' 4"  (1.626 m)   Wt 89.3 kg   LMP 01/30/2021   SpO2 100%   BMI 33.80 kg/m  EFM: 120/mod/15x15/none  CVE: Dilation: 9 Effacement (%): 100 Station: -1 Presentation: Vertex Exam by:: Dr. [email or web address]   A&P: 26 y.o. 26 [redacted]w[redacted]d  #Labor: Unchanged from earlier. Delivery is inevitable--start pit to allow effective pushing with contractions.  #Pain: Epidural  #FWB: Cat I  #GBS negative   [redacted]w[redacted]d, DO 8:58 PM

## 2021-09-06 NOTE — Transfer of Care (Signed)
Immediate Anesthesia Transfer of Care Note  Patient: Kathryn Navarro  Procedure(s) Performed: CERCLAGE CERVICAL (Cervix)  Patient Location: PACU and Labor and Deliver  Anesthesia Type:Spinal  Level of Consciousness: awake, alert  and oriented  Airway & Oxygen Therapy: Patient Spontanous Breathing  Post-op Assessment: Report given to RN and Post -op Vital signs reviewed and stable  Post vital signs: Reviewed and stable  Last Vitals:  Vitals Value Taken Time  BP 102/48 09/06/21 1615  Temp    Pulse 91 09/06/21 1615  Resp    SpO2 98 % 09/06/21 1615  Vitals shown include unvalidated device data.  Last Pain:  Vitals:   09/06/21 1403  TempSrc: Oral  PainSc:       Patients Stated Pain Goal: 0 (09/06/21 1346)  Complications: No notable events documented.

## 2021-09-06 NOTE — Anesthesia Procedure Notes (Addendum)
Spinal  Patient location during procedure: OR Start time: 09/06/2021 3:38 PM End time: 09/06/2021 3:41 PM Reason for block: surgical anesthesia Staffing Performed: anesthesiologist  Anesthesiologist: Kaylyn Layer, MD Preanesthetic Checklist Completed: patient identified, IV checked, risks and benefits discussed, monitors and equipment checked, pre-op evaluation and timeout performed Spinal Block Patient position: sitting Prep: DuraPrep and site prepped and draped Patient monitoring: heart rate, continuous pulse ox and blood pressure Approach: midline Location: L3-4 Injection technique: single-shot Needle Needle type: Pencan  Needle gauge: 24 G Needle length: 10 cm Assessment Sensory level: T4 Events: CSF return Additional Notes Risks, benefits, and alternative discussed. Patient gave consent to procedure. Prepped and draped in sitting position. Clear CSF obtained after one needle pass. Positive terminal aspiration. No pain or paraesthesias with injection. Patient tolerated procedure well. Vital signs stable. Amalia Greenhouse, MD

## 2021-09-06 NOTE — Progress Notes (Signed)
Patient ID: Anysha Frappier, female   DOB: Mar 10, 1996, 26 y.o.   MRN: [id number] [facility] ANTEPARTUM COMPREHENSIVE PROGRESS NOTE  Marishka Rentfrow is a 26 y.o. G3P0020 at [redacted]w[redacted]d  who is admitted for PROM.   Fetal presentation is cephalic on U/S 09/03/21 Length of Stay:  2  Days  Subjective: Earlier events noted. Pt reports feeling much better now. Denies ut ctx or vaginal bleeding. Reports good fetal movement.  Vitals:  Blood pressure 131/90, pulse (!) 107, temperature 98.3 F (36.8 C), temperature source Oral, resp. rate 19, height 5\' 4"  (1.626 m), weight 89.3 kg, last menstrual period 01/30/2021, SpO2 100 %, unknown if currently breastfeeding.  Physical Examination: Lungs clear Heart RRR Abd soft + BS gravid non tender GU deferred Ext non tender  Fetal Monitoring:  Baseline: 130's bpm, Variability: Good {> 6 bpm), Accelerations: Reactive, and Decelerations: Absent  Labs:  Results for orders placed or performed during the hospital encounter of 09/04/21 (from the past 24 hour(s))  Glucose, capillary   Collection Time: 09/05/21  1:06 PM  Result Value Ref Range   Glucose-Capillary 97 70 - 99 mg/dL  Glucose, capillary   Collection Time: 09/05/21  5:52 PM  Result Value Ref Range   Glucose-Capillary 136 (H) 70 - 99 mg/dL   Comment 1 Notify RN   Glucose, capillary   Collection Time: 09/05/21  7:45 PM  Result Value Ref Range   Glucose-Capillary 87 70 - 99 mg/dL  Glucose, capillary   Collection Time: 09/05/21 10:25 PM  Result Value Ref Range   Glucose-Capillary 138 (H) 70 - 99 mg/dL  Glucose, capillary   Collection Time: 09/06/21  6:06 AM  Result Value Ref Range   Glucose-Capillary 115 (H) 70 - 99 mg/dL  Glucose, capillary   Collection Time: 09/06/21  8:22 AM  Result Value Ref Range   Glucose-Capillary 97 70 - 99 mg/dL    Imaging Studies:    NA   Medications:  Scheduled  amoxicillin  500 mg Oral TID   docusate sodium  100 mg Oral Daily   insulin aspart  13  Units Subcutaneous BID WC   metFORMIN  1,000 mg Oral Q supper   prenatal multivitamin  1 tablet Oral Q1200   sodium chloride flush  3 mL Intravenous Q12H   I have reviewed the patient's current medications.  ASSESSMENT: IUP 31 4/7 weeks PROM Cerclage Velamentous cord insertion Class B DM   PLAN: Stable. Continue with latency antibiotics. S/P BMZ 5/8-9/23. Consider rescue dose. S/P NICU consult. No S/Sx of infection at present. Delivery at 34 weeks or for maternal/fetal indications. Will need removal of cerclage in OR. CBG's stable. Continue with current management Continue routine antenatal care.   10-28-1987 09/06/2021,11:59 AM

## 2021-09-06 NOTE — Progress Notes (Signed)
Labor Progress Note Kathryn Navarro is a 26 y.o. G3P0020 at [redacted]w[redacted]d who presented for PPROM, now in preterm labor.   S: Doing well. Comfortable after spinal from cerclage removal. Now able to move her legs more. No concerns. Family at bedside.   O:  BP 135/77   Pulse (!) 103   Temp 98.3 F (36.8 C) (Oral)   Resp 16   Ht 5\' 4"  (1.626 m)   Wt 89.3 kg   LMP 01/30/2021   SpO2 100%   BMI 33.80 kg/m   EFM: Baseline 125 bpm, moderate variability, + accels, no decels   CVE: Dilation: 10 Dilation Complete Date: 09/06/21 Dilation Complete Time: 1557 Effacement (%): 100 Station: Plus 1 Presentation: Vertex Exam by:: Nilani Hugill, MD  A&P: 26 y.o. 26 [redacted]w[redacted]d   #Labor: Magnesium started for neuroprotection upon arrival to L&D. Patient noted to be involuntarily pushing. Patient remains complete and plus 1 station with forebag noted.  #Pain: Spinal now starting to wear off some. Movement in LEs has returned. Anesthesia at bedside to place epidural for longer lasting pain control given anticipated delivery soon. Will reassess after placement.  #FWB: Cat 1  #GBS negative  [redacted]w[redacted]d, MD 5:54 PM

## 2021-09-06 NOTE — Anesthesia Postprocedure Evaluation (Signed)
Anesthesia Post Note  Patient: Kathryn Navarro  Procedure(s) Performed: CERCLAGE CERVICAL (Cervix)     Patient location during evaluation: PACU Anesthesia Type: Spinal Level of consciousness: awake and alert Pain management: pain level controlled Vital Signs Assessment: post-procedure vital signs reviewed and stable Respiratory status: spontaneous breathing, nonlabored ventilation and respiratory function stable Cardiovascular status: blood pressure returned to baseline Postop Assessment: no apparent nausea or vomiting, no headache and no backache Anesthetic complications: no Comments: Patient found to be completely dilated during cerclage removal (had not allowed any previous vaginal exams). Plan now for recovery and imminent delivery on L&D. Will do epidural at patient request. Stephannie Peters, MD   No notable events documented.  Last Vitals:  Vitals:   09/06/21 1631 09/06/21 1641  BP: (!) 96/51 (!) 93/43  Pulse: (!) 104 (!) 110  Resp:    Temp:    SpO2:      Last Pain:  Vitals:   09/06/21 1403  TempSrc: Oral  PainSc:    Pain Goal: Patients Stated Pain Goal: 0 (09/06/21 1346)                 Shanda Howells

## 2021-09-06 NOTE — Progress Notes (Signed)
Patient ID: Kathryn Navarro, female   DOB: [birth year], 26 y.o.   MRN: [id number] Pt is c/o ut ctx q 15 mins again. No vaginal bleeding Discussed with Noralee Space MFM, agrees and recommends removal of cerclage. Discussed with pt and FOB, indications for removal of cerclage. They verbalized understanding L & D OR notified. Discussed with NICU due to red flag status. Will monitor pt after removal and if does not progress into labor will attempt to transfer to another facility. Family aware of POC

## 2021-09-06 NOTE — Op Note (Signed)
Pre OP Dx: IUP 31 4/7, PROM, PTL and cerclage in place Post OP Dx: SAA with removal of cerclage x 2 Procedure: Removal of cerclage Surgeon: M. Wyndham Santilli Anesthisa: Spinal  Findings: C/C/+1 station, prolene cerclage x 2 removed  Procedure: After informed consent was obtained. Pt was prepped and draped in the usual sterile fashion. Speculum was placed and the above findings were noted. I was able to feel and secure 2 prolene cerclage sutures and remove them without problems. The procedure was the completed. Counts x 2 were correct. Pt was taken to L & D for further management Family was updated. L & D and NICU were notified.

## 2021-09-07 ENCOUNTER — Encounter (HOSPITAL_COMMUNITY): Payer: Self-pay | Admitting: Obstetrics & Gynecology

## 2021-09-07 LAB — CBC
HCT: 26.3 % — ABNORMAL LOW (ref 36.0–46.0)
HCT: 29.8 % — ABNORMAL LOW (ref 36.0–46.0)
Hemoglobin: 8.7 g/dL — ABNORMAL LOW (ref 12.0–15.0)
Hemoglobin: 9.8 g/dL — ABNORMAL LOW (ref 12.0–15.0)
MCH: 26.1 pg (ref 26.0–34.0)
MCH: 26.3 pg (ref 26.0–34.0)
MCHC: 32.9 g/dL (ref 30.0–36.0)
MCHC: 33.1 g/dL (ref 30.0–36.0)
MCV: 79 fL — ABNORMAL LOW (ref 80.0–100.0)
MCV: 79.9 fL — ABNORMAL LOW (ref 80.0–100.0)
Platelets: 263 10*3/uL (ref 150–400)
Platelets: 304 10*3/uL (ref 150–400)
RBC: 3.33 MIL/uL — ABNORMAL LOW (ref 3.87–5.11)
RBC: 3.73 MIL/uL — ABNORMAL LOW (ref 3.87–5.11)
RDW: 13.2 % (ref 11.5–15.5)
RDW: 13.3 % (ref 11.5–15.5)
WBC: 13.4 10*3/uL — ABNORMAL HIGH (ref 4.0–10.5)
WBC: 14.9 10*3/uL — ABNORMAL HIGH (ref 4.0–10.5)
nRBC: 0 % (ref 0.0–0.2)
nRBC: 0 % (ref 0.0–0.2)

## 2021-09-07 MED ORDER — TETANUS-DIPHTH-ACELL PERTUSSIS 5-2.5-18.5 LF-MCG/0.5 IM SUSY: 0.5000 mL | PREFILLED_SYRINGE | Freq: Once | INTRAMUSCULAR | Status: DC

## 2021-09-07 MED ORDER — IBUPROFEN 600 MG PO TABS
600.0000 mg | ORAL_TABLET | Freq: Four times a day (QID) | ORAL | Status: DC
  Administered 2021-09-07: 600 mg via ORAL
  Filled 2021-09-07 (×2): qty 1

## 2021-09-07 MED ORDER — DIPHENHYDRAMINE HCL 25 MG PO CAPS: 25.0000 mg | ORAL_CAPSULE | Freq: Four times a day (QID) | ORAL | Status: DC | PRN

## 2021-09-07 MED ORDER — WITCH HAZEL-GLYCERIN EX PADS: 1.0000 "application " | MEDICATED_PAD | CUTANEOUS | Status: DC | PRN

## 2021-09-07 MED ORDER — PRENATAL MULTIVITAMIN CH
1.0000 | ORAL_TABLET | Freq: Every day | ORAL | Status: DC
  Administered 2021-09-07: 1 via ORAL
  Filled 2021-09-07: qty 1

## 2021-09-07 MED ORDER — BENZOCAINE-MENTHOL 20-0.5 % EX AERO: 1.0000 "application " | INHALATION_SPRAY | CUTANEOUS | Status: DC | PRN

## 2021-09-07 MED ORDER — ACETAMINOPHEN 325 MG PO TABS: 650.0000 mg | ORAL_TABLET | ORAL | Status: DC | PRN

## 2021-09-07 MED ORDER — ZOLPIDEM TARTRATE 5 MG PO TABS: 5.0000 mg | ORAL_TABLET | Freq: Every evening | ORAL | Status: DC | PRN

## 2021-09-07 MED ORDER — ONDANSETRON HCL 4 MG PO TABS: 4.0000 mg | ORAL_TABLET | ORAL | Status: DC | PRN

## 2021-09-07 MED ORDER — NIFEDIPINE ER OSMOTIC RELEASE 30 MG PO TB24
30.0000 mg | ORAL_TABLET | Freq: Every day | ORAL | Status: DC
  Administered 2021-09-07 – 2021-09-08 (×2): 30 mg via ORAL
  Filled 2021-09-07 (×2): qty 1

## 2021-09-07 MED ORDER — SODIUM CHLORIDE 0.9% FLUSH: 3.0000 mL | INTRAVENOUS | Status: DC | PRN

## 2021-09-07 MED ORDER — FUROSEMIDE 20 MG PO TABS
20.0000 mg | ORAL_TABLET | Freq: Every day | ORAL | Status: DC
  Administered 2021-09-07 – 2021-09-08 (×2): 20 mg via ORAL
  Filled 2021-09-07 (×2): qty 1

## 2021-09-07 MED ORDER — SODIUM CHLORIDE 0.9 % IV SOLN: 250.0000 mL | INTRAVENOUS | Status: DC | PRN

## 2021-09-07 MED ORDER — MEASLES, MUMPS & RUBELLA VAC IJ SOLR: 0.5000 mL | Freq: Once | INTRAMUSCULAR | Status: DC

## 2021-09-07 MED ORDER — ONDANSETRON HCL 4 MG/2ML IJ SOLN: 4.0000 mg | INTRAMUSCULAR | Status: DC | PRN

## 2021-09-07 MED ORDER — COCONUT OIL OIL: 1.0000 "application " | TOPICAL_OIL | Status: DC | PRN

## 2021-09-07 MED ORDER — DIBUCAINE (PERIANAL) 1 % EX OINT: 1.0000 "application " | TOPICAL_OINTMENT | CUTANEOUS | Status: DC | PRN

## 2021-09-07 MED ORDER — SODIUM CHLORIDE 0.9% FLUSH: 3.0000 mL | Freq: Two times a day (BID) | INTRAVENOUS | Status: DC

## 2021-09-07 MED ORDER — SIMETHICONE 80 MG PO CHEW: 80.0000 mg | CHEWABLE_TABLET | ORAL | Status: DC | PRN

## 2021-09-07 MED ORDER — SENNOSIDES-DOCUSATE SODIUM 8.6-50 MG PO TABS
2.0000 | ORAL_TABLET | ORAL | Status: DC
  Administered 2021-09-07: 2 via ORAL

## 2021-09-07 NOTE — Lactation Note (Addendum)
This note was copied from a baby's chart.  NICU Lactation Consultation Note  Patient Name: Kathryn Navarro [id number] Date: 09/07/2021 Age:28 hours   Subjective Reason for consult: Initial assessment; Primapara; 1st time breastfeeding; NICU baby; Preterm <34wks  Lactation conducted an initial appointment to set up DEBP and provide basic breastfeeding education. I observed a full pumping session and provided education at the bedside. Droplets of colostrum noted; I encouraged oral care in the NICU. I set her up with a hands-free pumping top. Baby's name is Kathryn Navarro.  Stork pump emailed and confirmed by [employer] [facility] [employer]  Objective  Maternal data: [id number]  Vaginal, Spontaneous  Current breast feeding challenges:: NICU  Does the patient have breastfeeding experience prior to this delivery?: No  Pumping frequency: recommended q2-3 hours day/q3-4 hours/night Pumped volume: 0 mL (droplets) Flange Size: 24  Risk factor for low milk supply:: -- (GDM; PCOS)    Assessment Infant: Feeding Status: NPO   Maternal: Milk volume: Normal   Intervention/Plan Interventions: Breast feeding basics reviewed; DEBP; Education; "The NICU and Your Baby" book; LC Services brochure  Tools: Flanges; Pump Pump Education: Setup, frequency, and cleaning  Plan: Consult Status: Follow-up  NICU Follow-up type: New admission follow up    Kathryn Navarro 09/07/2021, 1:24 PM

## 2021-09-07 NOTE — Discharge Summary (Signed)
Postpartum Discharge Summary  Date of Service updated***     Patient Name: Kathryn Navarro DOB: 04/23/1995 MRN: [id number]  Date of admission: 09/04/2021 Delivery date:09/06/2021  Delivering provider: Patriciaann Clan  Date of discharge: 09/07/2021  Admitting diagnosis: Preterm premature rupture of membranes (PPROM) with onset of labor after 24 hours of rupture in third trimester, antepartum [O42.113] Intrauterine pregnancy: [redacted]w[redacted]d    Secondary diagnosis:  Principal Problem:   Preterm premature rupture of membranes (PPROM) with onset of labor after 24 hours of rupture in third trimester, antepartum  Additional problems: ***    Discharge diagnosis: {DX.:23714}                                              Post partum procedures:{Postpartum procedures:23558} Augmentation: {{WOEHOZYYQMGN:00370}Complications: {OB Labor/Delivery Complications:20784}  Hospital course: {Courses:23701}  Magnesium Sulfate received: {Mag received:30440022} BMZ received: {BMZ received:30440023} Rhophylac:{Rhophylac received:30440032} MWUG:{QBV:69450388}T-DaP:{Tdap:23962} Flu: {{EKC:00349}Transfusion:{Transfusion received:30440034}  Physical exam  Vitals:   09/06/21 2313 09/06/21 2319 09/06/21 2330 09/06/21 2344  BP: (!) 144/71 (!) 145/69 (!) 152/71 138/77  Pulse: (!) 122 (!) 125 (!) 118 (!) 114  Resp:      Temp:   97.9 F (36.6 C)   TempSrc:   Oral   SpO2:      Weight:      Height:       General: {Exam; general:21111117} Lochia: {Desc; appropriate/inappropriate:30686::"appropriate"} Uterine Fundus: {Desc; firm/soft:30687} Incision: {Exam; incision:21111123} DVT Evaluation: {Exam; dZPH:1505697}Labs: Lab Results  Component Value Date   WBC 14.0 (H) 09/04/2021   HGB 11.0 (L) 09/04/2021   HCT 34.4 (L) 09/04/2021   MCV 81.1 09/04/2021   PLT 356 09/04/2021      Latest Ref Rng & Units 09/04/2021    8:54 AM  CMP  Glucose 70 - 99 mg/dL 104    BUN 6 - 20 mg/dL <5    Creatinine 0.44 -  1.00 mg/dL 0.43    Sodium 135 - 145 mmol/L 137    Potassium 3.5 - 5.1 mmol/L 3.8    Chloride 98 - 111 mmol/L 106    CO2 22 - 32 mmol/L 21    Calcium 8.9 - 10.3 mg/dL 9.6    Total Protein 6.5 - 8.1 g/dL 6.7    Total Bilirubin 0.3 - 1.2 mg/dL 0.6    Alkaline Phos 38 - 126 U/L 66    AST 15 - 41 U/L 26    ALT 0 - 44 U/L 20     Edinburgh Score:     View : No data to display.           After visit meds:  Allergies as of 09/07/2021   No Known Allergies   [facility] Rec must [facility] completed [facility] to [facility]        Discharge home in stable condition Infant Feeding: {Baby feeding:23562} Infant Disposition:{CHL IP OB HOME WITH MXYIAXK:55374}Discharge instruction: per After Visit Summary and Postpartum booklet. Activity: Advance as tolerated. Pelvic rest for 6 weeks.  Diet: {OB dMOLM:78675449}Future Appointments: Future Appointments  Date Time Provider [place] 09/23/2021  8:15 AM WLuvenia Redden [facility]-C [facility]  Follow up Visit:   Please schedule this patient for a {Visit type:23955} postpartum visit in {Postpartum visit:23953} with the following provider: {Provider type:23954}. Additional Postpartum F/U:{PP Procedure:23957}  {Risk level:23960} pregnancy  complicated by: {OHKGOVPCHEKB:52481} Delivery mode:  Vaginal, Spontaneous  Anticipated Birth Control:  {Birth Control:23956}   09/07/2021 Patriciaann Clan, DO

## 2021-09-07 NOTE — Progress Notes (Signed)
POSTPARTUM PROGRESS NOTE  Post Partum Day 1  Subjective:  Merrill Deanda is a 26 y.o. [id number] s/p VD at [redacted]w[redacted]d.  She reports she is doing well. No acute events overnight after delivery. She denies any problems with ambulating, voiding or po intake. Denies nausea or vomiting.  Pain is well controlled.  Lochia is mild.  Infant in NICU, plans to visit him a lot today.  Objective: Blood pressure 125/80, pulse (!) 109, temperature 98.9 F (37.2 C), temperature source Oral, resp. rate 18, height 5\' 4"  (1.626 m), weight 89.3 kg, last menstrual period 01/30/2021, SpO2 99 %, unknown if currently breastfeeding.  Physical Exam:  General: alert, cooperative and no distress Chest: no respiratory distress Heart:regular rate, distal pulses intact Uterine Fundus: firm, appropriately tender DVT Evaluation: No calf swelling or tenderness Extremities: minimal edema Skin: warm, dry  Recent Labs    09/07/21 0037 09/07/21 0604  HGB 9.8* 8.7*  HCT 29.8* 26.3*    Assessment/Plan: Tyonna Talerico is a 26 y.o. 26 s/p VD/pre-term delivery at [redacted]w[redacted]d   PPD#1 - Doing well  Routine postpartum care  #Elevated blood pressure postpartum: Mild range only, asymptomatic. Start procardia and 5 day course of lasix.   #Acute blood loss anemia: Hgb 8.7 this am. Asymptomatic. Offered IV venofer vs oral iron-- however patient would like to increase iron rich foods in her diet instead. Discussed iron rich foods to intake.   Contraception: None Feeding: planning for breast Dispo: Plan for discharge tomorrow.   LOS: 3 days   [redacted]w[redacted]d, DO  OB Fellow  09/07/2021, 8:45 AM

## 2021-09-07 NOTE — Clinical Social Work Maternal (Signed)
CLINICAL SOCIAL WORK MATERNAL/CHILD NOTE  Patient Details  Name: Kathryn Navarro MRN: [id number] Date of Birth: 08-03-1995  Date:  09/07/2021  Clinical Social Worker Initiating Note:  Abundio Miu, [place] Date/Time: Initiated:  09/07/21/1502     Child's Name:  Kathryn Navarro   Biological Parents:  Mother, Father (Father: Kimbella Heisler)   Need for Interpreter:  Other (Comment Required) (FOB speaks english; MOB understands and reads english but speaks *Gujarati (will need interpreter if FOB is not present with her))   Reason for Referral:  Parental Support of Premature Babies < 32 weeks/or Critically Ill babies   Address:  [address] [place] [address]    Phone number:  [phone] (home)     Additional phone number:   Household Members/Support Persons (HM/SP):   Household Member/Support Person 1, Household Member/Support Person 2   HM/SP Name Relationship DOB or Age  HM/SP -1 Ronak Tenorio Husband/FOB    HM/SP -2   Mother in law    HM/SP -3        HM/SP -4        HM/SP -5        HM/SP -6        HM/SP -7        HM/SP -8          Natural Supports (not living in the home):      Professional Supports: None   Employment: Animator   Type of Work: Electrical engineer:  Forensic psychologist   Homebound arranged:    Museum/gallery curator Resources:  Multimedia programmer    Other Resources:      Cultural/Religious Considerations Which May Impact Care:    Strengths:  Ability to meet basic needs  , Understanding of illness   Psychotropic Medications:         Pediatrician:       Pediatrician List:   [place]      Pediatrician Fax Number:    Risk Factors/Current Problems:  None   Cognitive State:  Able to Concentrate  , Alert  , Goal Oriented  , Linear Thinking     Mood/Affect:  Calm  , Happy  , Interested  , Comfortable  , Relaxed     CSW Assessment: CSW met  with MOB at bedside to complete psychosocial assessment, mother in law present. CSW offered to get an interpreter, MOB declined and requested that CSW wait for FOB to arrive so FOB could translate. FOB came into the room minutes later. CSW introduced self and explained role. Parents were pleasant and remained engaged during assessment. FOB reported that they reside together along with his mother. MOB reported that she is a Dance movement psychotherapist. FOB reported that they have not started to shop for infant as they thought they had more time to prepare. CSW informed parents about [facility]'s [employer] if any assistance is needed obtaining items for infant. FOB reported that a referral for all essential items would be helpful, CSW agreed to complete referral. FOB reported that they would be able to get a car seat and crib for infant.CSW inquired about MOB's support system, FOB reported that he is MOB's only support.   CSW inquired about MOB's mental health history. MOB denied any mental health history. CSW inquired about how MOB was feeling emotionally since giving birth, MOB reported great  and happy. FOB explained that MOB always wants to be in the NICU and he has had to encourage her to get some rest. CSW acknowledged MOB's feelings of always wanting to be with infant. CSW explained the importance of rest and having a healthy mom for a healthy baby. MOB presented calm and did not demonstrate any acute mental health signs/symptoms. CSW assessed for safety, MOB denied SI and HI. CSW did not assess for domestic violence as FOB was present.   CSW provided education regarding the baby blues period vs. perinatal mood disorders, discussed treatment and gave resources for mental health follow up if concerns arise.  CSW recommends self-evaluation during the postpartum time period using the New Mom Checklist from Postpartum Progress and encouraged MOB to contact a medical professional if symptoms are noted at  any time.    CSW provided review of Sudden Infant Death Syndrome (SIDS) precautions.    CSW and parents discussed infant's NICU admission. CSW informed parents about the NICU, what to expect and resources/supports available while infant is admitted to the NICU. FOB reported that they feel well informed about infant's care and denied any transportation barriers with visiting infant in the NICU. FOB reported that meal vouchers would be helpful. CSW agreed to place meal vouchers at infant's bedside. Parents denied any questions/concerns regarding the NICU.   CSW asked about an interpreter for MOB in case FOB is not present. FOB reported that MOB will need an interpreter if he is not present and the needed language is [place].   Parents opted for CSW to check in weekly to provide support. CSW will continue to offer resources/supports while infant is admitted to the NICU.   CSW placed 6 meal vouchers at infant's bedside.   CSW made FSN referral for requested items.   CSW Plan/Description:  Psychosocial Support and Ongoing Assessment of Needs, Sudden Infant Death Syndrome (SIDS) Education, Perinatal Mood and Anxiety Disorder (PMADs) Education, Other Patient/Family Education, Other Information/Referral to [employer], [place] 09/07/2021, 3:07 PM

## 2021-09-07 NOTE — Anesthesia Postprocedure Evaluation (Signed)
Anesthesia Post Note  Patient: Elektra Wartman  Procedure(s) Performed: AN AD HOC LABOR EPIDURAL     Patient location during evaluation: Mother Baby Anesthesia Type: Epidural Level of consciousness: awake and alert Pain management: pain level controlled Vital Signs Assessment: post-procedure vital signs reviewed and stable Respiratory status: spontaneous breathing, nonlabored ventilation and respiratory function stable Cardiovascular status: stable Postop Assessment: no headache, no backache and epidural receding Anesthetic complications: no   No notable events documented.  Last Vitals:  Vitals:   09/07/21 0300 09/07/21 0800  BP: 125/80 120/84  Pulse: (!) 109 (!) 103  Resp: 18 18  Temp: 37.2 C 37 C  SpO2: 99% 100%    Last Pain:  Vitals:   09/07/21 0800  TempSrc: Oral  PainSc: 0-No pain   Pain Goal: Patients Stated Pain Goal: 0 (09/06/21 1346)                 Jay Kempe

## 2021-09-08 ENCOUNTER — Ambulatory Visit: Payer: Self-pay

## 2021-09-08 LAB — SURGICAL PATHOLOGY

## 2021-09-08 MED ORDER — IBUPROFEN 600 MG PO TABS: 600.0000 mg | ORAL_TABLET | Freq: Four times a day (QID) | ORAL | 0 refills | Status: AC

## 2021-09-08 MED ORDER — FUROSEMIDE 20 MG PO TABS: 20.0000 mg | ORAL_TABLET | Freq: Every day | ORAL | 0 refills | Status: DC

## 2021-09-08 NOTE — Lactation Note (Signed)
This note was copied from a baby's chart.  NICU Lactation Consultation Note  Patient Name: Kathryn Navarro [id number] Date: 09/08/2021 Age:28 hours  Subjective Reason for consult: Primapara; 1st time breastfeeding; NICU baby; Maternal discharge; Maternal endocrine disorder; Follow-up assessment; Preterm <34wks; Infant < 6lbs  Visited with mom of 28 hours old pre-term NICU female; she requested that her visitor acts as her interpreter for "Gujarati". Kathryn Navarro is going home today; reviewed discharge education, pumping schedule, lactogenesis II and expectations. She hasn't been pumping consistently, explained to her the importance of consistent pumping for the onset of lactogenesis II and to prevent engorgement; she voiced understanding. She asked for bottles and labels, NICU RN Junie Panning printed the labels and did 2 step verification with this LC. Re-educated mom on breastmilk storage guidelines.  Objective Infant data: Mother's Current Feeding Choice: Breast Milk and Donor Milk  Maternal data: [id number]  Vaginal, Spontaneous Current breast feeding challenges:: NICU Does the patient have breastfeeding experience prior to this delivery?: No Pumping frequency: 2 times/24 hours Pumped volume: 0 mL (drops) Flange Size: 24 Risk factor for low milk supply:: -- (GDM; PCOS) Pump: Stork Pump  Assessment Infant: Feeding status: on donor milk  Maternal: Milk volume: Normal  Intervention/Plan Interventions: Breast feeding basics reviewed; DEBP; Education; "The NICU and Your Baby" book; [facility] brochure Tools: Pump; Flanges Pump Education: Setup, frequency, and cleaning; Milk Storage  Plan of care: Encouraged mom to start pumping consistently every 3 hours, at least 8 pumping sessions/24 hours She'll start bringing her breastmilk to the NICU once her supply comes into volume; she has a Stork pump for home use  Female visitor present. All questions and concerns answered, family to contact [facility]  services PRN.  Consult Status: Follow-up NICU Follow-up type: Verify onset of copious milk; Verify absence of engorgement; Weekly NICU follow up   [employer] 09/08/2021, 12:38 PM

## 2021-09-09 ENCOUNTER — Telehealth: Payer: Self-pay | Admitting: Family Medicine

## 2021-09-09 NOTE — Telephone Encounter (Signed)
Mailed patient letter with postpartum appointments due to her being discharged from hospital.

## 2021-09-15 ENCOUNTER — Telehealth (HOSPITAL_COMMUNITY): Payer: Self-pay | Admitting: *Deleted

## 2021-09-15 NOTE — Telephone Encounter (Signed)
Patient's husband reports she is with baby in NICU at present, but she is doing great. Reports she has an appointment scheduled with provider this week. No concerns about mom.  Duffy Rhody, [place] 09-15-2021 at 12:04pm

## 2021-09-16 ENCOUNTER — Ambulatory Visit (INDEPENDENT_AMBULATORY_CARE_PROVIDER_SITE_OTHER): Payer: 59

## 2021-09-16 VITALS — BP 127/79 | HR 122 | Wt 188.5 lb

## 2021-09-16 DIAGNOSIS — Z0130 Encounter for examination of blood pressure without abnormal findings: Secondary | ICD-10-CM

## 2021-09-16 NOTE — Progress Notes (Signed)
Blood Pressure Check Visit  Kathryn Navarro is here for blood pressure check following spontaneous vaginal delivery on 09/06/21 in setting of elevated BP without pre-eclampsia. BP today is 127/79, HR 122; pt is asymptomatic. Reports completing 5 day course of Lasix. Denies taking Nifedipine that was prescribed at discharge. Pain is well controlled with ibuprofen.  Pt to follow up at [facility] visit on 10/15/21. Reviewed 2 HR GTT to be done at time of visit. Pt will follow up before if needed.   09/16/2021  10:36 AM

## 2021-09-17 ENCOUNTER — Ambulatory Visit: Payer: Self-pay

## 2021-09-17 NOTE — Lactation Note (Signed)
This note was copied from a baby's chart.  NICU Lactation Consultation Note  Patient Name: Boy Judithann Villamar RUEAV'W Date: 09/17/2021 Age:26 years   Subjective Reason for consult: Breastfeeding assistance LC present with remote interpreter to assist with lick & learn breastfeeding. Mother was very comfortable positioning infant. Infant held breast in mouth but did not seal or suckle. We reviewed normalcy of this behavior at 33 weeks. I encouraged mother to pre-pump and bring infant to breast during feeding times when she is present and he is awake.  Mother is pumping frequently and without difficulty.  Objective Infant data: Mother's Current Feeding Choice: Breast Milk  Infant feeding assessment Scale for Readiness: 2   Maternal data: [id number]  Vaginal, Spontaneous  Pumping frequency: q3h Pumped volume: 120 mL   Pump: Stork Pump  Assessment Infant: LATCH Score: 5  Maternal: Milk volume: Normal   Intervention/Plan Interventions: Infant Driven Feeding Algorithm education; Education; Position options  Plan: Consult Status: NICU follow-up  NICU Follow-up type: Assist with IDF-1 (Mother to pre-pump before breastfeeding)  Mother to continue current pumping poc   Elder Negus 09/17/2021, 11:52 AM

## 2021-09-23 ENCOUNTER — Ambulatory Visit: Payer: Self-pay

## 2021-09-23 ENCOUNTER — Encounter: Payer: 59 | Admitting: Medical

## 2021-09-23 NOTE — Lactation Note (Signed)
This note was copied from a baby's chart.  NICU Lactation Consultation Note  Patient Name: Kathryn Navarro CNOBS'J Date: 09/23/2021 Age:28 wk.o.   Subjective Reason for consult: Follow-up assessment I spoke with FOB today. He recalls that mother and infant are consistently practicing lick and learn bf'ing. Per FOB, most sessions last 2-3 minutes but one session yesterday was >15 minutes. Mother was pleased. We reviewed normalcy of behaviors at 34 weeks. I offered to return later today prn when mom is in the room. Otherwise, FOB is aware of LC services daily in NICU.  Objective Infant data: Mother's Current Feeding Choice: Breast Milk  Maternal data: [id number]  Vaginal, Spontaneous  Pumping frequency: q3h Pumped volume: 120 mL  Pump: Stork Pump  Assessment Maternal: Milk volume: Normal   Intervention/Plan Interventions: Visual merchandiser education; Education   Plan: Consult Status: NICU follow-up  NICU Follow-up type: Assist with IDF-1 (Mother to pre-pump before breastfeeding)  Mother to continue lick and learn bf'ing and pumping q3h  Elder Negus 09/23/2021, 9:15 AM

## 2021-09-29 ENCOUNTER — Ambulatory Visit: Payer: Self-pay

## 2021-09-29 NOTE — Lactation Note (Signed)
This note was copied from a Kathryn's chart.  NICU Lactation Consultation Note  Patient Name: Kathryn Navarro Date: 09/29/2021 Age:28 wk.o.   Subjective Reason for consult: Follow-up assessment  Lactation followed up with Kathryn Navarro. She has been practicing "lick and learn" with Kathryn Navarro. Kathryn resting in her lap upon entry. Lactation set up appointment for 1100 on 6/15 to observe and assist with breastfeeding.  Kathryn Navarro states that she pumps both breasts prior to practicing lick and learn. Her milk volume is WNL.  Objective Infant data: Mother's Current Feeding Choice: Breast Milk  Infant feeding assessment Scale for Readiness: 2  Maternal data: [id number]  Vaginal, Spontaneous  Pumping frequency: q3 hours (q2-3-4 hours per mom) Pumped volume: 120 mL (3.5 - 5.5 oz)   Pump: Personal  Assessment  Maternal: Milk volume: Normal   Intervention/Plan No data recorded Pump Education: Setup, frequency, and cleaning  Plan: Consult Status: NICU follow-up  NICU Follow-up type: Weekly NICU follow up    Walker Shadow 09/29/2021, 11:51 AM

## 2021-09-30 ENCOUNTER — Ambulatory Visit: Payer: Self-pay

## 2021-09-30 NOTE — Lactation Note (Signed)
This note was copied from a baby's chart. Lactation Consultation Note  Patient Name: Kathryn Navarro XAJOI'N Date: 09/30/2021 Reason for consult: Follow-up assessment;NICU baby;Late-preterm 34-36.6wks;Maternal endocrine disorder;Infant < 6lbs;Primapara;1st time breastfeeding;Mother's request;Breastfeeding assistance Age:28 wk.o.  Used [employer] interpreter services for [employer]" interpreter # [phone] "Hetal"  Visited with mom of 35 0/7 weeks (adjusted) NICU female, she's a P1 and requested a feeding assist for the 11 am feeding. Baby already awake when entered the room but cueing was minimal, mom eager to take baby to breast. LC assisted with latching and positioning (see LATCH score). Ms. Whitsell is eager to try bottles as well, explained to her that the SLP will come to assess baby before moving to bottle feedings. Noticed that baby had difficulty managing the bolus he had into his mouth due to maternal let down when doing lick & learn; explained to mom that baby might not be ready to handle volumes quite yet.  Maternal Data  Maternal supply is WNL  Feeding Mother's Current Feeding Choice: Breast Milk  LATCH Score Latch: Repeated attempts needed to sustain latch, nipple held in mouth throughout feeding, stimulation needed to elicit sucking reflex. (only a few sucks, on and off, baby mainly held mother's nipple into his mouth, NNS was the only pattern)  Audible Swallowing: None (only a couple, mom had a let down when baby had nipple in his mouth and didn't know what to do with the milk, baby spilled some milk, mom wiped it. LC gently inserted finger afterwards and that's where the two small swallows were heard)  Type of Nipple: Everted at rest and after stimulation  Comfort (Breast/Nipple): Soft / non-tender  Hold (Positioning): Assistance needed to correctly position infant at breast and maintain latch. (But mom holds baby in her own way, she voiced she doesn't feel comfortable holding the  back of baby's neck)  LATCH Score: 6  Lactation Tools Discussed/Used Tools: Pump;Flanges Flange Size: 24 Breast pump type: Double-Electric Breast Pump Pump Education: Setup, frequency, and cleaning;Milk Storage Reason for Pumping: LPI in NICU Pumping frequency: 8 times/24 hours Pumped volume: 90 mL  Interventions Interventions: Breast feeding basics reviewed;DEBP;Education;Infant Driven Feeding Algorithm education  Plan of care Encouraged mom to continue pumping consistently every 3 hours, at least 8 pumping sessions/24 hours She'll continue taking baby to a pumped breast on feeding cues around feeding times   No other support person a this time. All questions and concerns answered, family to contact [facility] services PRN.  Discharge Pump: Stork Pump  Consult Status Consult Status: NICU follow-up Date: 09/30/21 Follow-up type: In-patient   Nickolaus Bordelon Venetia Constable 09/30/2021, 12:35 PM

## 2021-10-02 ENCOUNTER — Ambulatory Visit: Payer: Self-pay

## 2021-10-02 NOTE — Lactation Note (Signed)
This note was copied from a baby's chart.  NICU Lactation Consultation Note  Patient Name: Kathryn Navarro HOZYY'Q Date: 10/02/2021 Age:28 wk.o.   Subjective Reason for consult: Follow-up assessment; NICU baby; Other (Comment); Primapara; 1st time breastfeeding; Infant < 6lbs; Late-preterm 34-36.6wks; Maternal endocrine disorder (NP order)  Used Tax inspector interpreter services for "Gujarati" audio intepreter # [phone] "Kathryn Navarro"  Visited with mom of 48 24/67 weeks old (adjusted) NICU female, NP Candise Bowens asked this LC to check on mom, she advanced the feeding plan to IDF 2. Reviewed feeding plan with mom and offered to come back for the 5 pm feeding. She asked again about bottle feedings, let her know that we'll check with SLP for them to come in tomorrow. Kathryn Navarro prefers to have a joined consult with SLP/LC tomorrow at 11 am; sent a secure chat message to communicate with both teams.   Objective Infant data: Mother's Current Feeding Choice: Breast Milk  Infant feeding assessment Scale for Readiness: 2  Maternal data: [id number]  Vaginal, Spontaneous Risk factor for low milk supply:: GDM, PCOS, infertility Pump: Stork Pump  Assessment Infant: LATCH Score: 8 Feeding Status: -- (Scheduled)  Maternal: Milk supply is WNL @ 90 ml x 8 times/24 hours  Intervention/Plan Interventions: Education; Infant Driven Feeding Algorithm education  Plan of care: Encouraged mom to continue pumping consistently every 3 hours, at least 8 pumping sessions/24 hours She'll start taking baby to a full breast on feeding cues around feeding times LC to F/U on 06/17 for feeding assist   Female visitor present. All questions and concerns answered, family to contact [facility] services PRN.  Consult Status: NICU follow-up NICU Follow-up type: Assist with IDF-2 (Mother does not need to pre-pump before breastfeeding)   Kathryn Navarro 10/02/2021, 4:30 PM

## 2021-10-03 ENCOUNTER — Ambulatory Visit: Payer: Self-pay

## 2021-10-03 NOTE — Lactation Note (Signed)
This note was copied from a baby's chart.  NICU Lactation Consultation Note  Patient Name: Kathryn Navarro Date: 10/03/2021 Age:28 wk.o.   Subjective Reason for consult: Follow-up assessment; Mother's request; RN request  I was paged to the room to assist with the 1100 feeding. Mother had not pre-pumped and RN indicated an overnight change to IDF-2. Family appears anxious to advance po feedings and infant fed overnight x2 without pre-pumping.   Neither SLP nor LC have yet evaluated a feeding at full breast. Considering the most recent SLP notes, a readiness score of 4, the overnight weight loss, and there was not an SLP present at this time, I shared my concern about proceeding with feeding at a full breast. I suggested that mother pre-pump for this feeding and we reschedule for 1100 tomorrow when SLP can be present for the evaluation. Mother agreed. I shared my conversation with RN Ambrose Mantle and NP Leonor Liv. I will f/u with SLP team on Monday morning.  Objective Infant data: Mother's Current Feeding Choice: Breast Milk  Infant feeding assessment Scale for Readiness: 4 Scale for Quality: 4    Maternal data: [id number]  Vaginal, Spontaneous  Pumping frequency: q3h Pumped volume: 90 mL  Risk factor for low milk supply:: GDM, PCOS, infertility   Pump: Stork Pump  Assessment Maternal: Milk volume: Normal   Intervention/Plan Interventions: Education; Infant Driven Feeding Algorithm education  Plan: Consult Status: NICU follow-up  NICU Follow-up type: Assist with IDF-1 (Mother to pre-pump before breastfeeding); Assist with IDF-2 (Mother does not need to pre-pump before breastfeeding)  LC to plan return visit Monday at 1100 to assess infant at full breast alongside of SLP.  Elder Negus 10/03/2021, 11:38 AM

## 2021-10-04 ENCOUNTER — Ambulatory Visit: Payer: Self-pay

## 2021-10-04 NOTE — Lactation Note (Signed)
This note was copied from a baby's chart.  NICU Lactation Consultation Note  Patient Name: Kathryn Navarro UORVI'F Date: 10/04/2021 Age:28 wk.o.   Subjective Reason for consult: Follow-up assessment  LC and SLP presented to room for joint consult with family who is concerned about mixed-messaging surrounding infant feeding. We discussed feeding norms at 35 weeks and reviewed previously provided education about the IDF algorithm. The RN provided a written copy of the IDF algorithm.   The parents are in agreement that following the infant's feeding readiness cues is most appropriate. They are in agreement that mother should continue to pre-pump for breastfeeding (IDF-1) and infant should receive the entire gavage. The SLP and LC will return frequently to assess and will monitor infant's progress and readiness to advance to IDF-2 (no pre-pumping for breastfeeding).  Objective Infant data: Mother's Current Feeding Choice: Breast Milk  Infant feeding assessment Scale for Readiness: 2 Scale for Quality: 2     Maternal data: [id number]  Vaginal, Spontaneous  Pumping frequency: q3h Pumped volume: 90 mL  Pump: Stork Pump  Assessment Infant: LATCH Score: 6  Feeding Status: IDF-1   Maternal: Milk volume: Normal   Intervention/Plan Interventions: Education; Infant Driven Feeding Algorithm education  Plan: Consult Status: NICU follow-up  NICU Follow-up type: Assist with IDF-1 (Mother to pre-pump before breastfeeding)  Mother to continue pre-pumping prior to breastfeeding.   Kathryn Navarro 10/04/2021, 11:55 AM

## 2021-10-06 ENCOUNTER — Ambulatory Visit: Payer: Self-pay

## 2021-10-06 NOTE — Lactation Note (Addendum)
This note was copied from a baby's chart.  NICU Lactation Consultation Note  Patient Name: Kathryn Navarro NOIBB'C Date: 10/06/2021 Age:28 wk.o.  Subjective Reason for consult: Follow-up assessment; Breastfeeding assistance; NICU baby; Infant < 6lbs; Primapara; 1st time breastfeeding; Late-preterm 34-36.6wks; Maternal endocrine disorder  Visited with mom for a joint consult with SLP Dacia for the 11 am feeding assist but she had decided to work on bottle feedings instead. Noticed that mom had breastmilk in one of her Dr. Manson Passey bottles she brought from home from the previous feeding, revised storage guidelines and encouraged them to refrigerate everything; parents voiced they're aware of that and will follow recommendations for breastmilk storage guidelines.   Objective Infant data: Mother's Current Feeding Choice: Breast Milk  Infant feeding assessment Scale for Readiness: 2 Scale for Quality: 4 (congestion pointed out to mom but she kept feeding)   Maternal data: [id number]  Vaginal, Spontaneous Pump: Stork Pump  Assessment Infant: LATCH Score: 6 Feeding Status: IDF-1  Intervention/Plan Interventions: Education  Plan of care: Encouraged mom to continue pumping consistently every 3 hours, at least 8 pumping sessions/24 hours She'll continue working on bottle feedings She'll call for breastfeeding assistance PRN   FOB present; he served as an Equities trader for this encounter. All questions and concerns answered, family to contact [facility] services PRN.  Consult Status: NICU follow-up NICU Follow-up type: Weekly NICU follow up   Anistyn Graddy Venetia Constable 10/06/2021, 12:34 PM

## 2021-10-11 ENCOUNTER — Ambulatory Visit: Payer: Self-pay

## 2021-10-15 ENCOUNTER — Ambulatory Visit: Payer: 59 | Admitting: Medical

## 2021-10-15 ENCOUNTER — Other Ambulatory Visit: Payer: 59

## 2021-10-29 ENCOUNTER — Ambulatory Visit: Payer: 59 | Admitting: Obstetrics and Gynecology

## 2021-10-29 ENCOUNTER — Other Ambulatory Visit: Payer: 59

## 2021-11-12 ENCOUNTER — Other Ambulatory Visit: Payer: 59

## 2021-11-12 ENCOUNTER — Ambulatory Visit: Payer: 59 | Admitting: Obstetrics and Gynecology

## 2021-11-12 ENCOUNTER — Other Ambulatory Visit: Payer: Self-pay

## 2021-11-12 DIAGNOSIS — O24415 Gestational diabetes mellitus in pregnancy, controlled by oral hypoglycemic drugs: Secondary | ICD-10-CM

## 2021-11-18 ENCOUNTER — Other Ambulatory Visit: Payer: 59

## 2021-11-18 ENCOUNTER — Other Ambulatory Visit: Payer: Self-pay

## 2021-11-18 DIAGNOSIS — O24415 Gestational diabetes mellitus in pregnancy, controlled by oral hypoglycemic drugs: Secondary | ICD-10-CM | POA: Diagnosis not present

## 2021-11-19 LAB — GLUCOSE TOLERANCE, 2 HOURS
Glucose, 2 hour: 111 mg/dL (ref 70–139)
Glucose, GTT - Fasting: 98 mg/dL (ref 70–99)

## 2021-12-07 ENCOUNTER — Ambulatory Visit: Payer: 59 | Admitting: Advanced Practice Midwife

## 2021-12-28 ENCOUNTER — Ambulatory Visit (INDEPENDENT_AMBULATORY_CARE_PROVIDER_SITE_OTHER): Payer: 59 | Admitting: Family Medicine

## 2021-12-28 ENCOUNTER — Other Ambulatory Visit: Payer: Self-pay

## 2021-12-28 DIAGNOSIS — Z23 Encounter for immunization: Secondary | ICD-10-CM | POA: Diagnosis not present

## 2021-12-28 NOTE — Progress Notes (Signed)
Post Partum Visit Note  Kathryn Navarro is a 26 y.o. [id number] female who presents for a postpartum visit. She is several weeks postpartum following a normal spontaneous vaginal delivery.  I have fully reviewed the prenatal and intrapartum course. The delivery was at [redacted]w[redacted]d gestational weeks.  Anesthesia: epidural. Postpartum course has been uncomplicated. Baby is doing well. Baby is feeding by both breast and bottle - Similac . Bleeding no bleeding. Bowel function is normal. Bladder function is normal. Patient is not sexually active. Contraception method is none. Postpartum depression screening: negative.   The pregnancy intention screening data noted above was reviewed. Potential methods of contraception were discussed. The patient elected to proceed with No data recorded.   Edinburgh Postnatal Depression Scale - 12/28/21 1328       Edinburgh Postnatal Depression Scale:  In the Past 7 Days   I have been able to laugh and see the funny side of things. 0    I have looked forward with enjoyment to things. 0    I have blamed myself unnecessarily when things went wrong. 0    I have been anxious or worried for no good reason. 0    I have felt scared or panicky for no good reason. 0    Things have been getting on top of me. 0    I have been so unhappy that I have had difficulty sleeping. 0    I have felt sad or miserable. 0    I have been so unhappy that I have been crying. 0    The thought of harming myself has occurred to me. 0    Edinburgh Postnatal Depression Scale Total 0             Health Maintenance Due  Topic Date Due   COVID-19 Vaccine (1) Never done   FOOT EXAM  Never done   OPHTHALMOLOGY EXAM  Never done   URINE MICROALBUMIN  Never done   TETANUS/TDAP  Never done   HEMOGLOBIN A1C  12/08/2021    The following portions of the patient's history were reviewed and updated as appropriate: allergies, current medications, past family history, past medical history, past  social history, past surgical history, and problem list.  Review of Systems Pertinent items are noted in HPI.  Objective:  BP 123/77   Pulse 96   Wt 183 lb (83 kg)   Breastfeeding Yes   BMI 31.41 kg/m    General:  alert, cooperative, and no distress   Breasts:  not indicated  Lungs: clear to auscultation bilaterally  Heart:  regular rate and rhythm, S1, S2 normal, no murmur, click, rub or gallop  Abdomen: soft, non-tender; bowel sounds normal; no masses,  no organomegaly   Wound NA  GU exam:   Deferred        Assessment:    Normal 12 week postpartum exam.   Plan:   Essential components of care per ACOG recommendations:  1.  Mood and well being: Patient with negative depression screening today. Reviewed local resources for support.  - Patient tobacco use? No.   - hx of drug use? No.    2. Infant care and feeding:  -Patient currently breastmilk feeding? Yes. Reviewed importance of draining breast regularly to support lactation.  -Social determinants of health (SDOH) reviewed in EPIC. No concerns  3. Sexuality, contraception and birth spacing - Patient does not know want a pregnancy in the next year.  Desired family size is 2 children.  -  Reviewed reproductive life planning. Reviewed contraceptive methods based on pt preferences and effectiveness.  Patient desired No Method - No Contraceptive Precautions today.   - Discussed birth spacing of 18 months  4. Sleep and fatigue -Encouraged family/partner/community support of 4 hrs of uninterrupted sleep to help with mood and fatigue  5. Physical Recovery  - Discussed patients delivery and complications. She describes her labor as good. - Patient had a Vaginal, no problems at delivery. Patient had a 2nd degree laceration. Perineal healing reviewed. Patient expressed understanding - Patient has urinary incontinence? No. - Patient is safe to resume physical and sexual activity  6.  Health Maintenance - HM due items addressed  Yes - Last pap smear  Diagnosis  Date Value Ref Range Status  11/06/2019   Final   - Negative for intraepithelial lesion or malignancy (NILM)   Pap smear not done at today's visit.  -Breast Cancer screening indicated? No.   7. Chronic Disease/Pregnancy Condition follow up: None  - PCP follow up  Celedonio Savage, MD [facility],  [facility]

## 2022-04-28 IMAGING — US US OB < 14 WEEKS - US OB TV
1 series · 15 of 28 positions shown · non-contrast
Comparison: None.

CLINICAL DATA: Pelvic pain

EXAM:
OBSTETRIC <14 WK US AND TRANSVAGINAL OB US
TECHNIQUE: Both transabdominal and transvaginal ultrasound examinations were
performed for complete evaluation of the gestation as well as the
maternal uterus, adnexal regions, and pelvic cul-de-sac.
Transvaginal technique was performed to assess early pregnancy.

[Series 1: us ob < 14 weeks - us ob tv · 132 acquisitions, 15 frames shown]
[im 1/132]
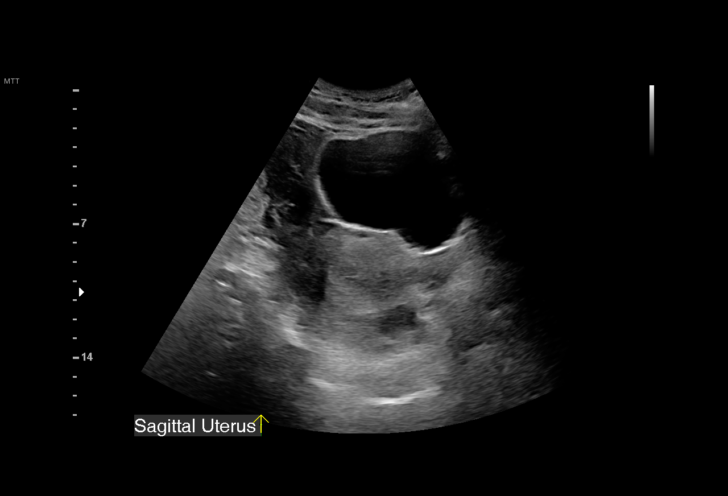
[im 10/132]
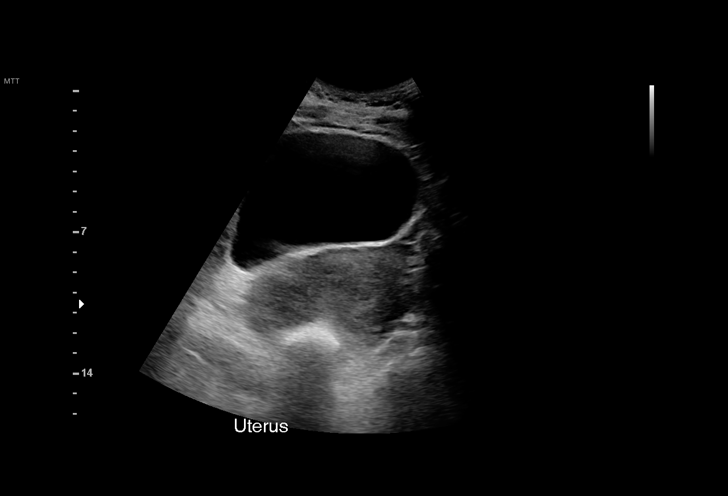
[im 20/132]
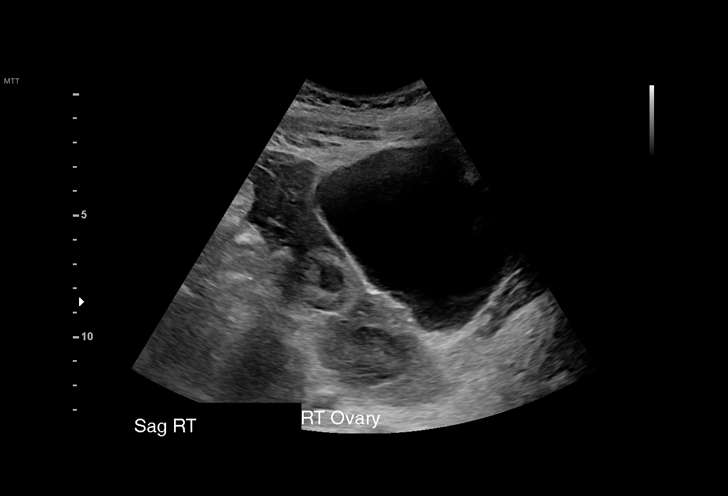
[im 30/132]
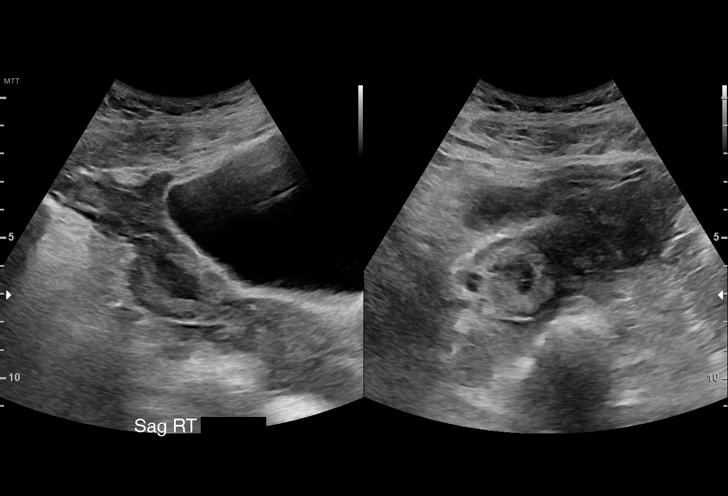
[im 39/132]
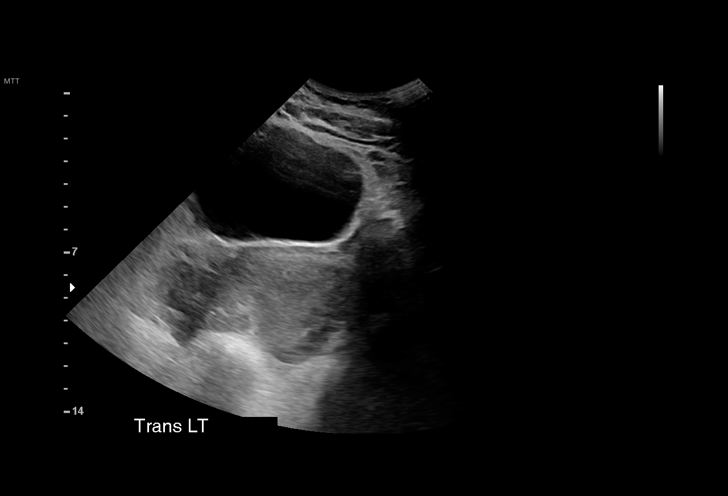
[im 49/132]
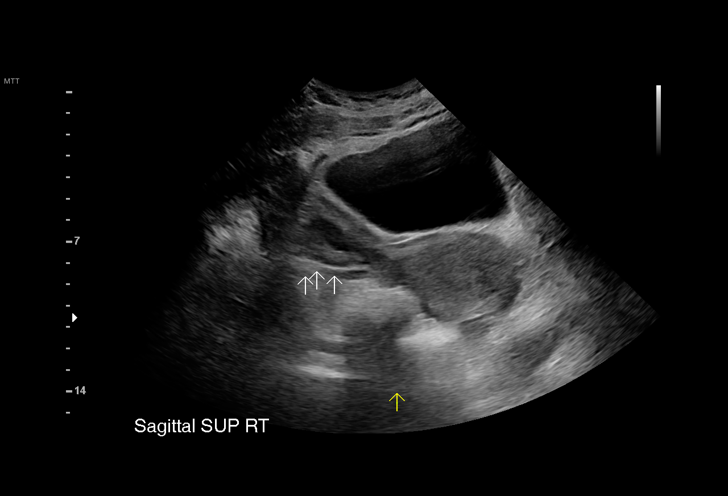
[im 59/132]
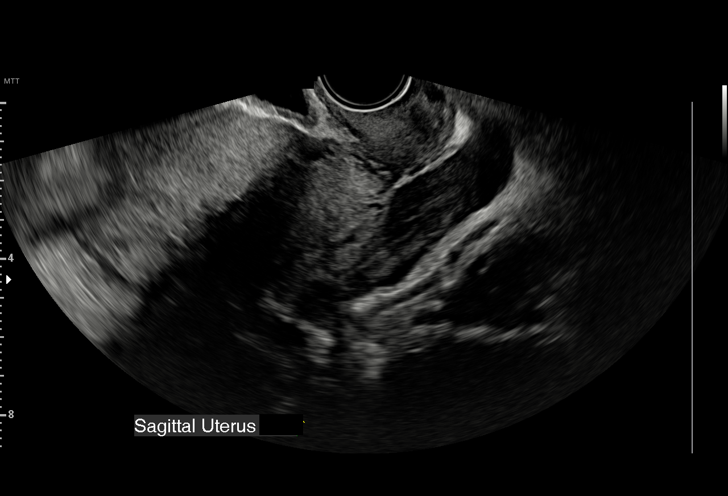
[im 68/132]
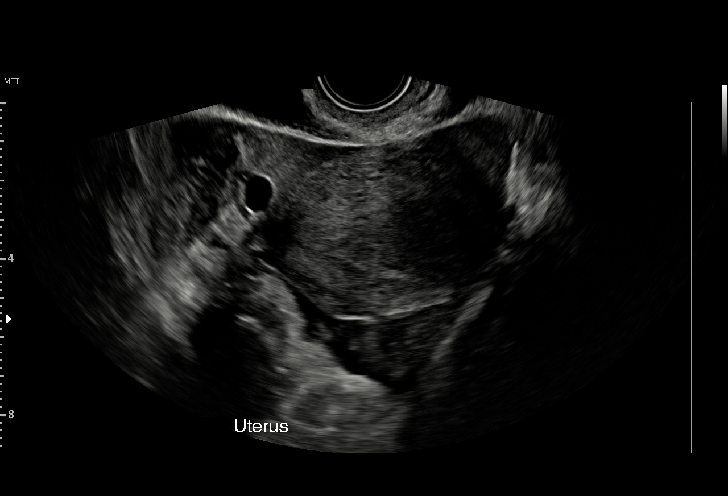
[im 73/132]
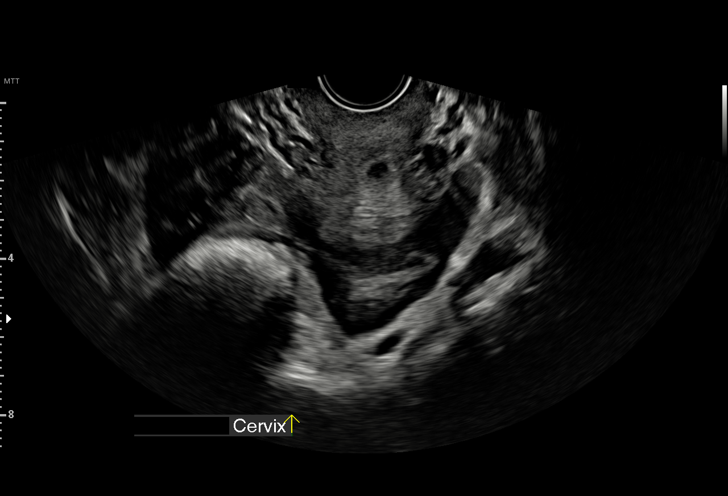
[im 83/132]
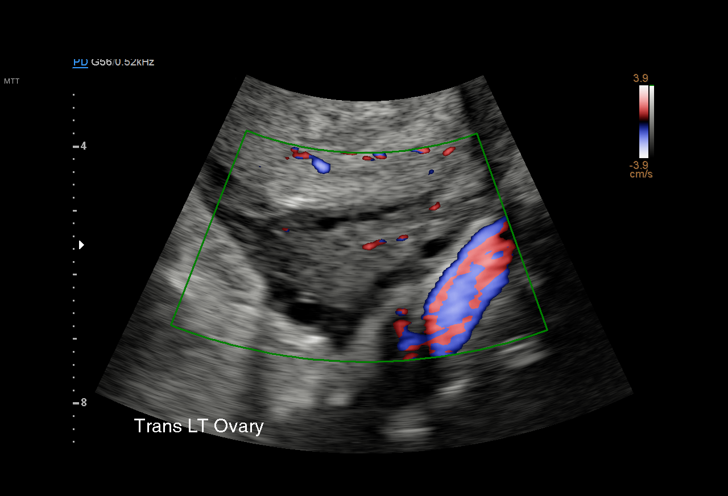
[im 93/132]
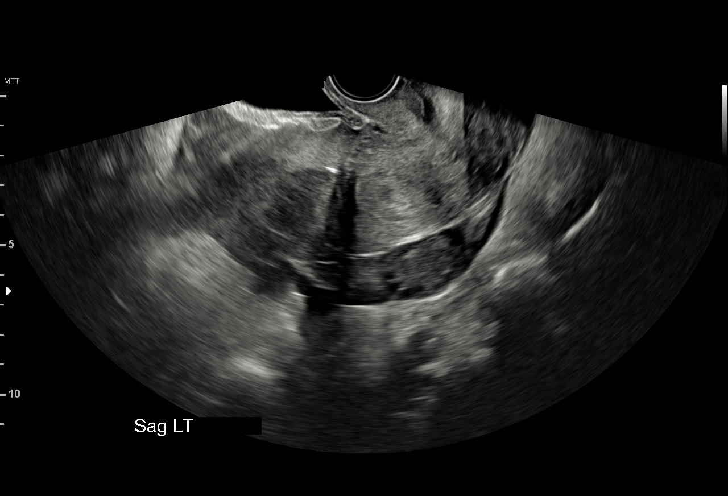
[im 102/132]
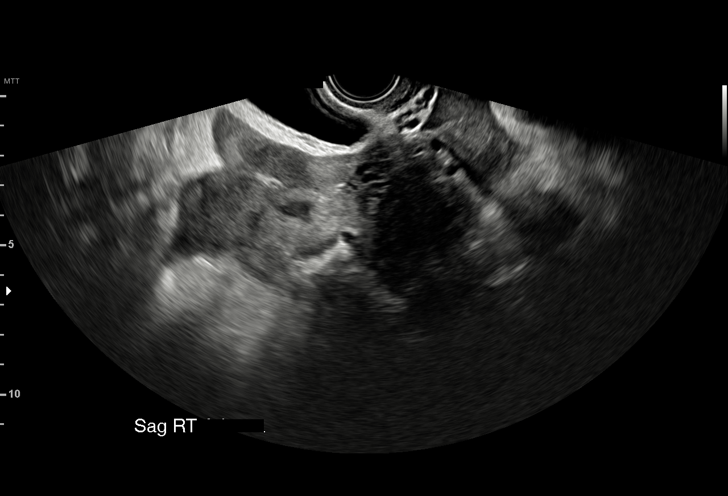
[im 112/132]
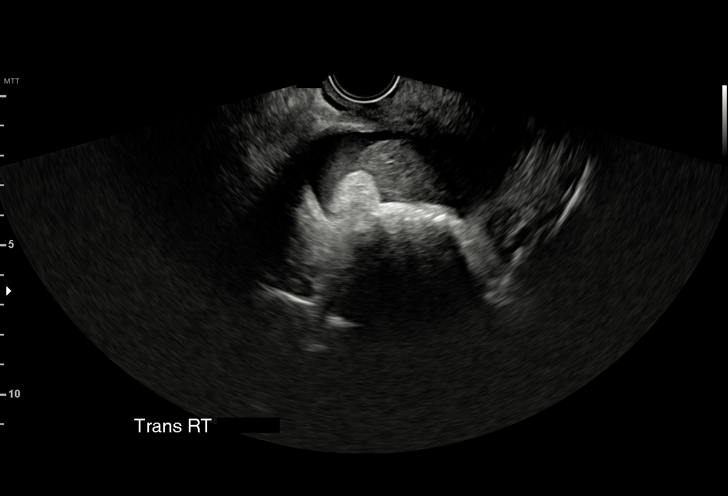
[im 122/132]
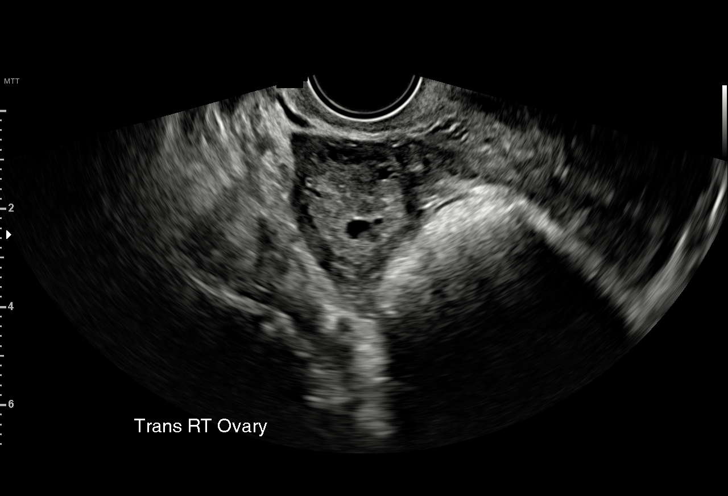
[im 132/132]
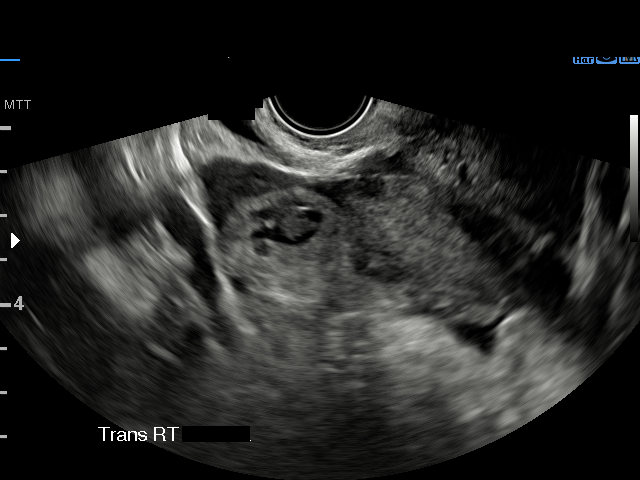

[15 of 28 positions shown; findings below may reference images not displayed]

FINDINGS: Intrauterine gestational sac: None

Yolk sac:  Not Visualized.

Embryo:  Not Visualized.

Maternal uterus/adnexae: Ovaries are within normal limits. Right
ovary measures 2.5 by 3.7 x 3.6 cm. The left ovary measures 3.1 by 3
x 1.9 cm. Heterogenous right adnexal mass with echogenic rim,
hypoechoic center and peripheral vascularity. This measures 3.3 x 2
x 2.6 cm. Large amount of complex fluid within the cul-de-sac and
adnexa.
IMPRESSION: 1. No IUP identified. 3.3 cm complex right adnexal mass with large
amount of complex fluid in the pelvis. Findings concerning for
ruptured right ectopic pregnancy.

Critical Value/emergent results were called by telephone at the time
of interpretation on 12/09/2019 at [DATE] to provider MALISA
ESTEPHANIA , who verbally acknowledged these results.

## 2022-08-19 DIAGNOSIS — E282 Polycystic ovarian syndrome: Secondary | ICD-10-CM | POA: Diagnosis not present

## 2022-08-19 DIAGNOSIS — Z1329 Encounter for screening for other suspected endocrine disorder: Secondary | ICD-10-CM | POA: Diagnosis not present

## 2022-08-19 DIAGNOSIS — Z0000 Encounter for general adult medical examination without abnormal findings: Secondary | ICD-10-CM | POA: Diagnosis not present

## 2022-08-19 DIAGNOSIS — N926 Irregular menstruation, unspecified: Secondary | ICD-10-CM | POA: Diagnosis not present

## 2022-08-19 DIAGNOSIS — Z13220 Encounter for screening for lipoid disorders: Secondary | ICD-10-CM | POA: Diagnosis not present

## 2022-08-19 DIAGNOSIS — Z131 Encounter for screening for diabetes mellitus: Secondary | ICD-10-CM | POA: Diagnosis not present

## 2022-08-25 DIAGNOSIS — Z0000 Encounter for general adult medical examination without abnormal findings: Secondary | ICD-10-CM | POA: Diagnosis not present

## 2022-08-25 DIAGNOSIS — Z1329 Encounter for screening for other suspected endocrine disorder: Secondary | ICD-10-CM | POA: Diagnosis not present

## 2022-08-25 DIAGNOSIS — Z113 Encounter for screening for infections with a predominantly sexual mode of transmission: Secondary | ICD-10-CM | POA: Diagnosis not present

## 2022-08-25 DIAGNOSIS — Z124 Encounter for screening for malignant neoplasm of cervix: Secondary | ICD-10-CM | POA: Diagnosis not present

## 2022-08-25 DIAGNOSIS — Z131 Encounter for screening for diabetes mellitus: Secondary | ICD-10-CM | POA: Diagnosis not present

## 2022-08-25 DIAGNOSIS — Z01411 Encounter for gynecological examination (general) (routine) with abnormal findings: Secondary | ICD-10-CM | POA: Diagnosis not present

## 2022-08-25 DIAGNOSIS — Z13220 Encounter for screening for lipoid disorders: Secondary | ICD-10-CM | POA: Diagnosis not present

## 2022-09-02 DIAGNOSIS — E782 Mixed hyperlipidemia: Secondary | ICD-10-CM | POA: Diagnosis not present

## 2022-09-02 DIAGNOSIS — E119 Type 2 diabetes mellitus without complications: Secondary | ICD-10-CM | POA: Diagnosis not present

## 2023-01-11 IMAGING — US US OB < 14 WEEKS - US OB TV
1 series · 15 of 28 positions shown · non-contrast
Comparison: Obstetric ultrasound dated 12/09/2019.

CLINICAL DATA: Abdominal pain. Gestational age by last menstrual
period of 4 weeks 6 days. Beta hCG not available at the time of this
dictation.

EXAM:
OBSTETRIC <14 WK US AND TRANSVAGINAL OB US
TECHNIQUE: Both transabdominal and transvaginal ultrasound examinations were
performed for complete evaluation of the gestation as well as the
maternal uterus, adnexal regions, and pelvic cul-de-sac.
Transvaginal technique was performed to assess early pregnancy.

[Series 1: us ob < 14 weeks - us ob tv · 33 acquisitions, 15 frames shown]
[im 1/33]
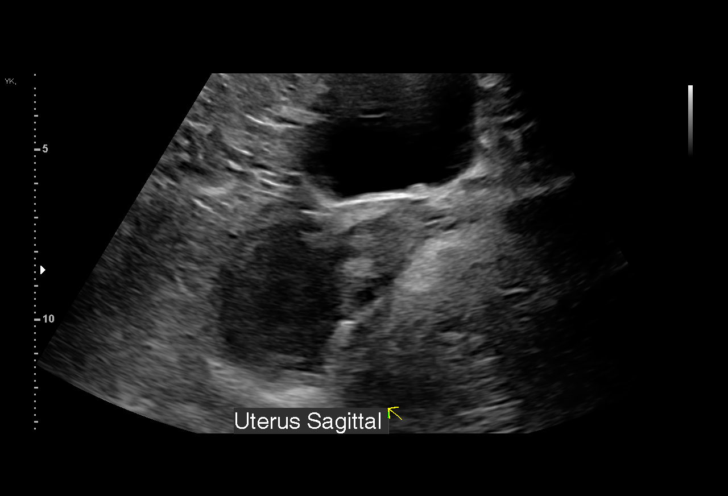
[im 3/33]
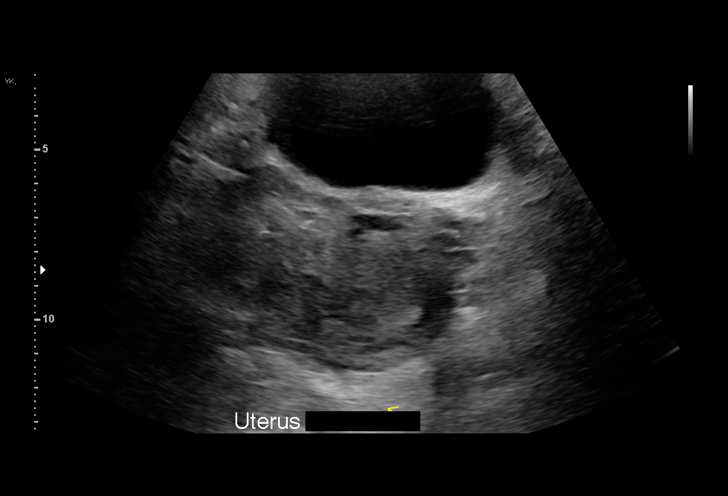
[im 5/33]
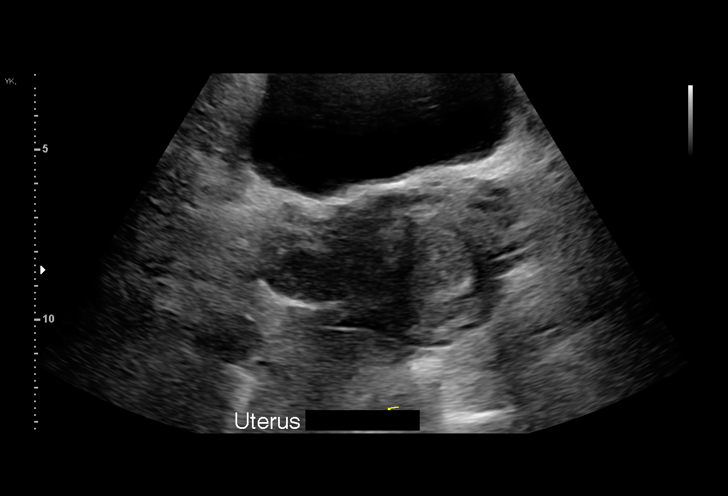
[im 8/33]
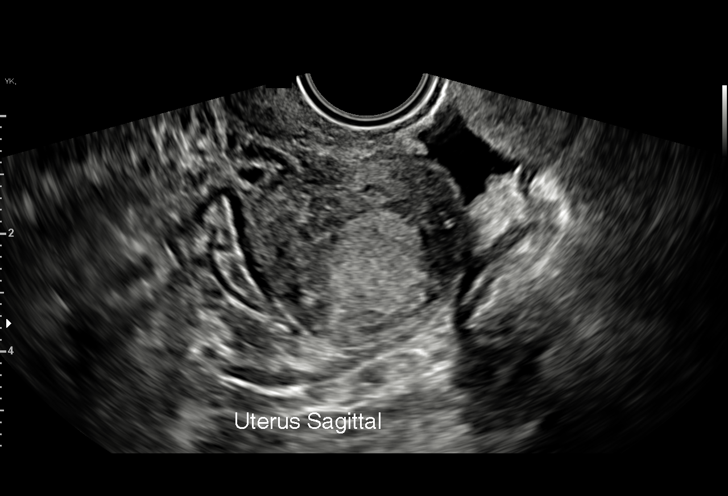
[im 10/33]
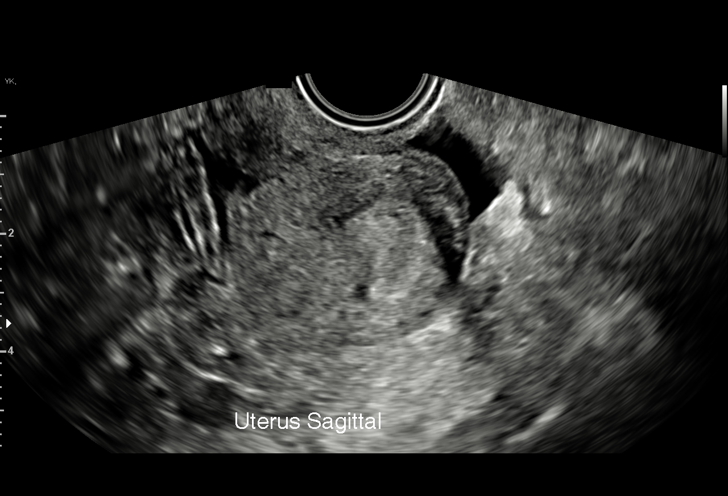
[im 12/33]
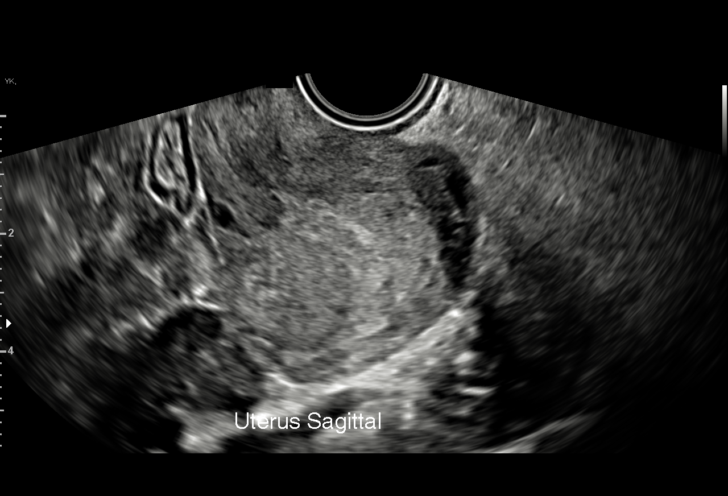
[im 15/33]
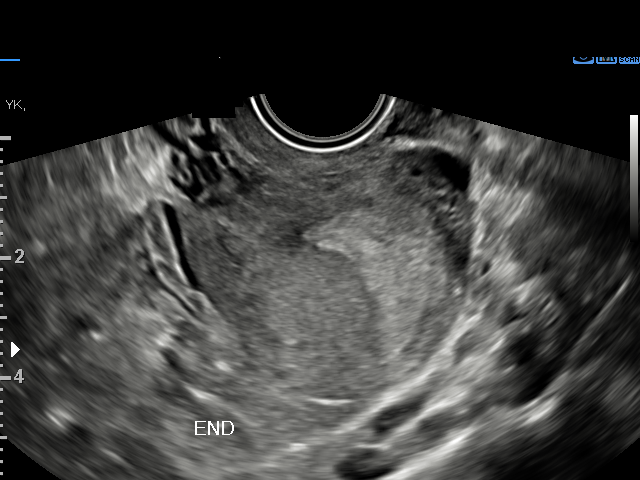
[im 17/33]
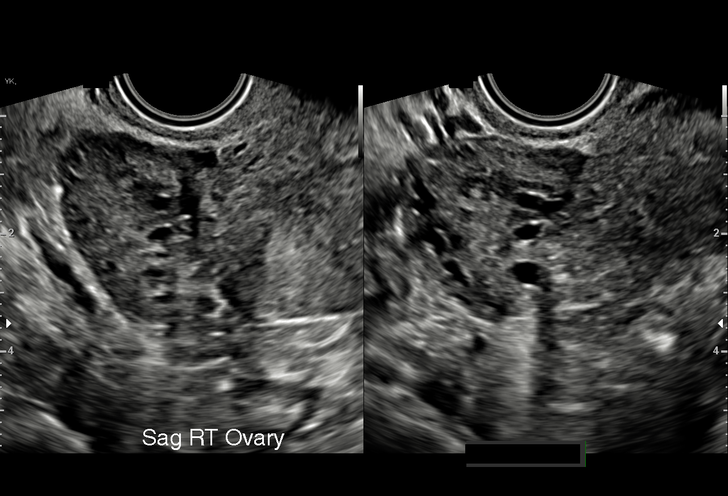
[im 18/33]
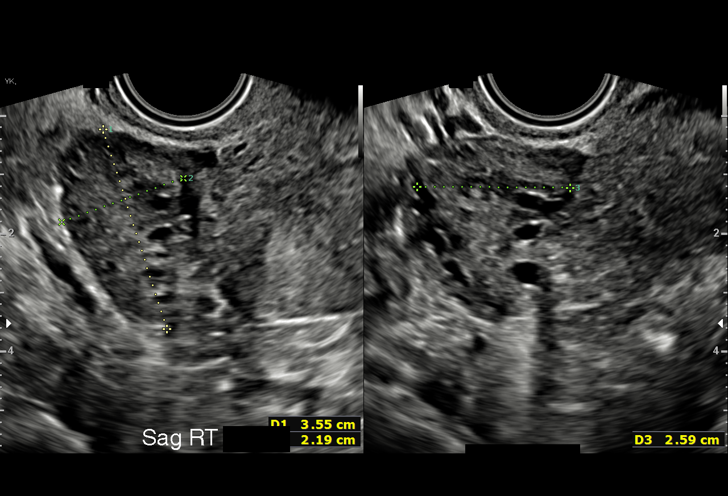
[im 21/33]
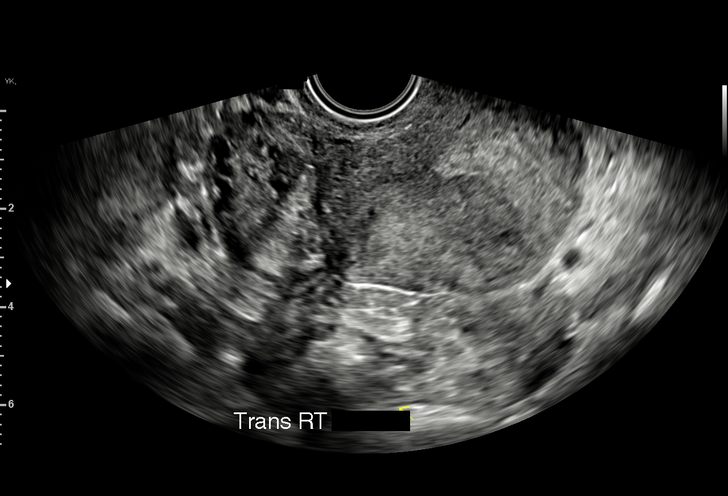
[im 23/33]
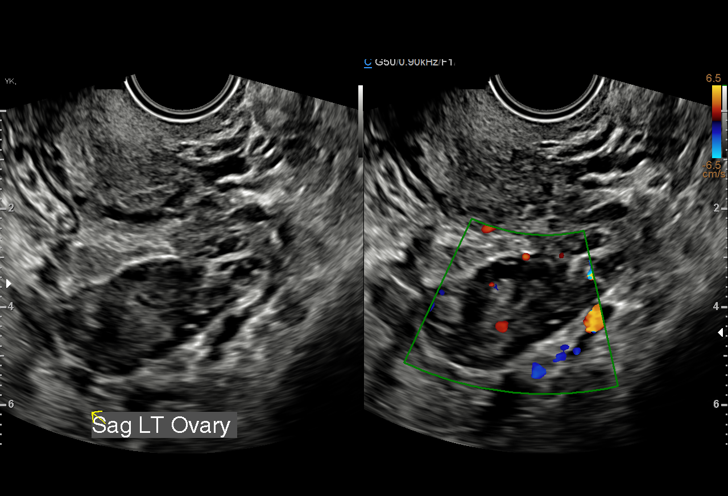
[im 25/33]
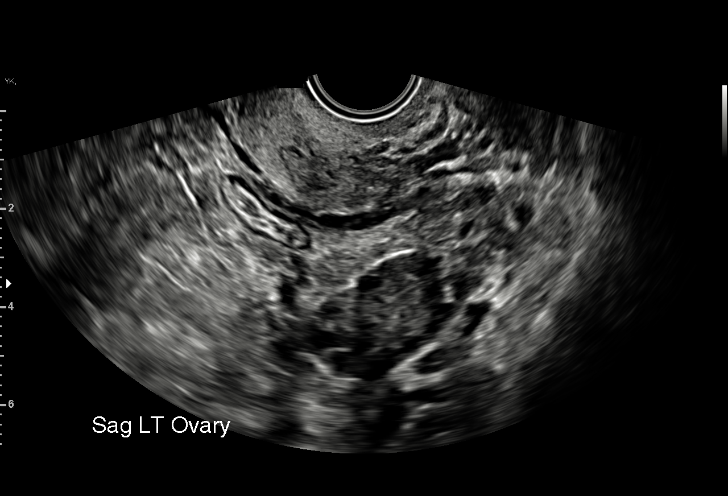
[im 28/33]
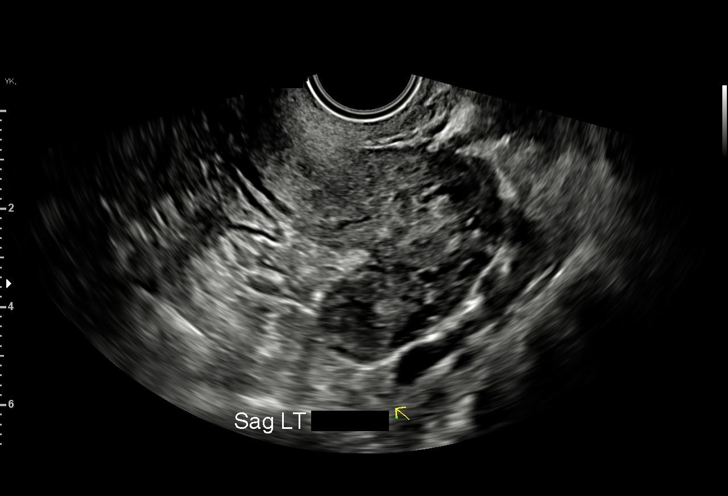
[im 30/33]
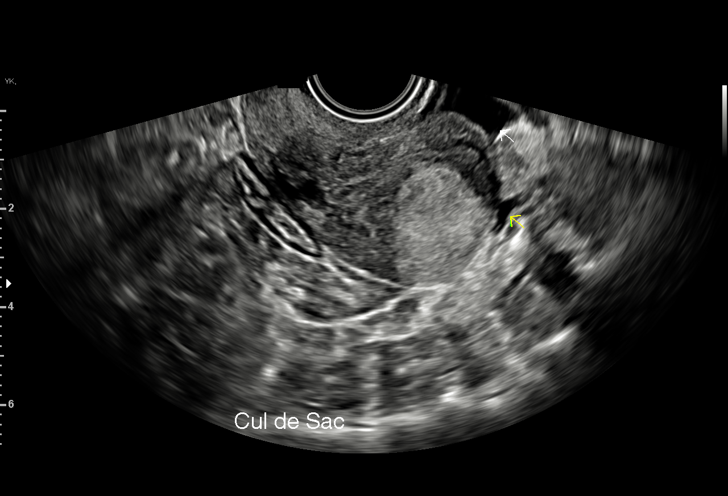
[im 33/33]
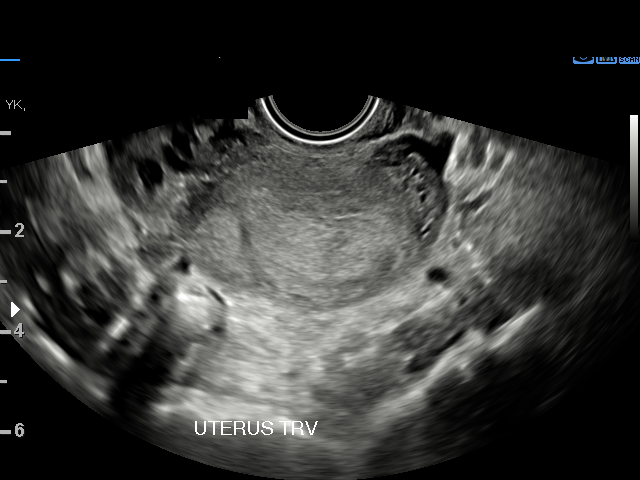

[15 of 28 positions shown; findings below may reference images not displayed]

FINDINGS: Intrauterine gestational sac: None

Yolk sac:  Not Visualized.

Embryo:  Not Visualized.

Cardiac Activity: Not Visualized.

Subchorionic hemorrhage:  None visualized.

Maternal uterus/adnexae: Normal appearing. Small volume free
intraperitoneal fluid.
IMPRESSION: No intrauterine pregnancy is identified. Small volume free
intraperitoneal fluid.

## 2023-06-29 DIAGNOSIS — Z0000 Encounter for general adult medical examination without abnormal findings: Secondary | ICD-10-CM | POA: Diagnosis not present

## 2023-11-13 IMAGING — US US MFM OB DETAIL+14 WK
1 series · 15 of 28 positions shown · non-contrast
Comparison: none

[Series 1: us mfm ob detail+14 wk · 15 of 111 slices shown]
[im 1/111]
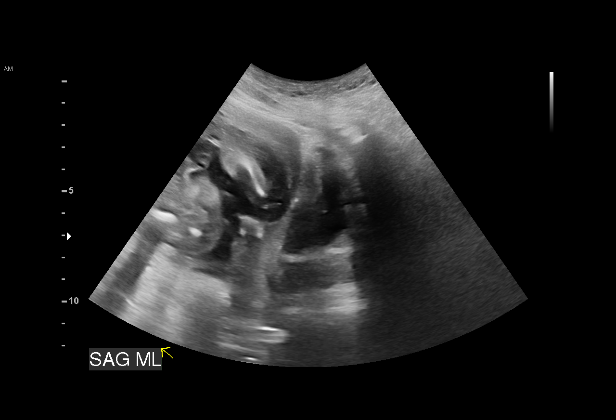
[im 9/111]
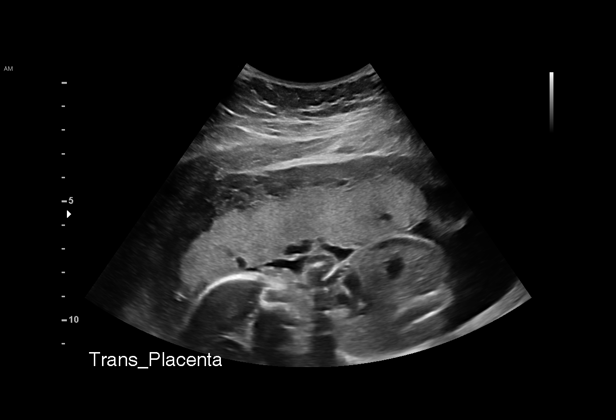
[im 17/111]
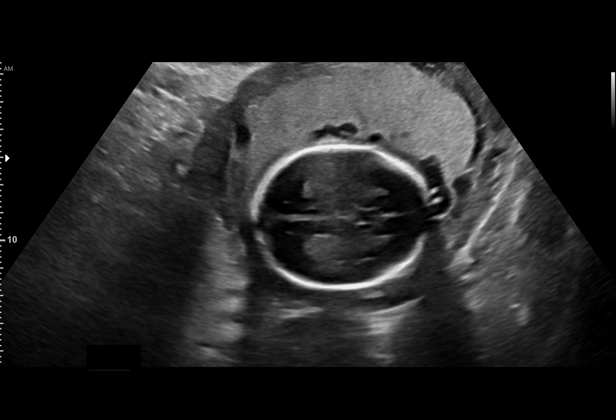
[im 25/111]
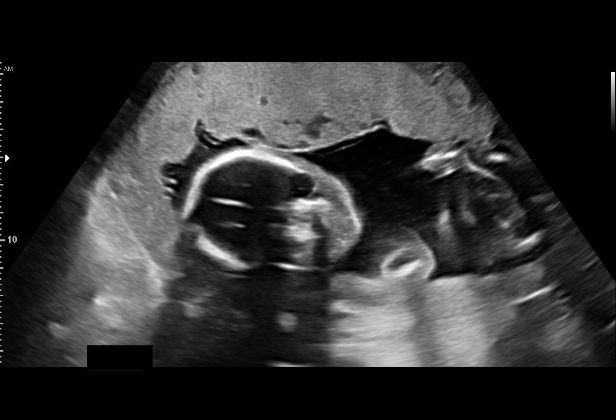
[im 33/111]
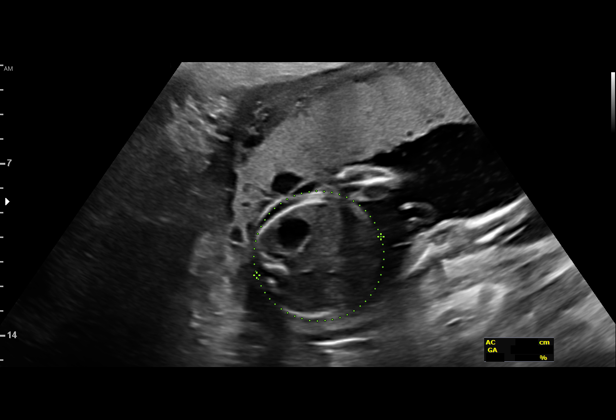
[im 41/111]
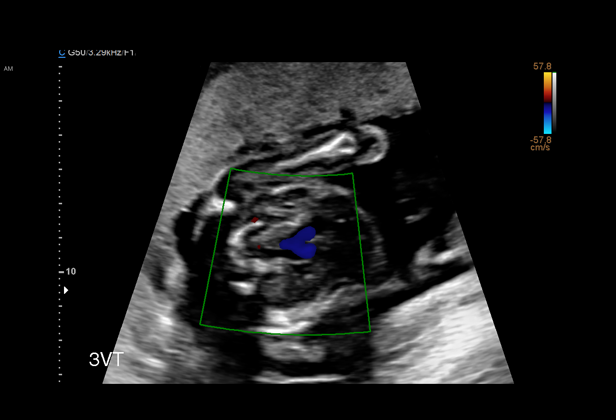
[im 49/111]
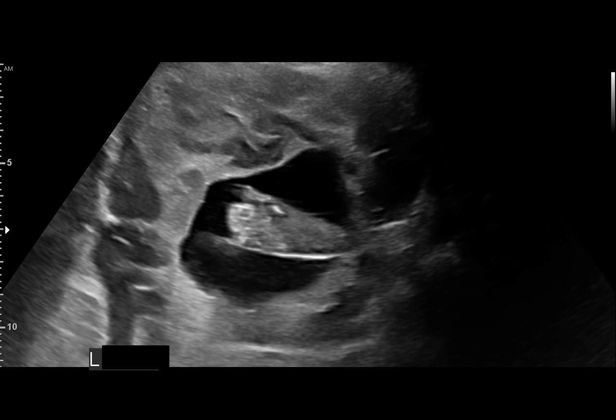
[im 58/111]
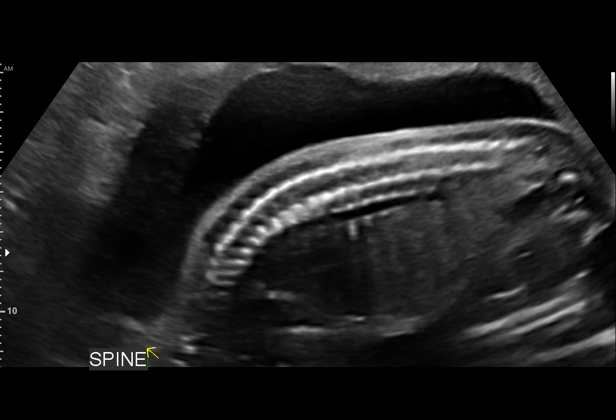
[im 62/111]
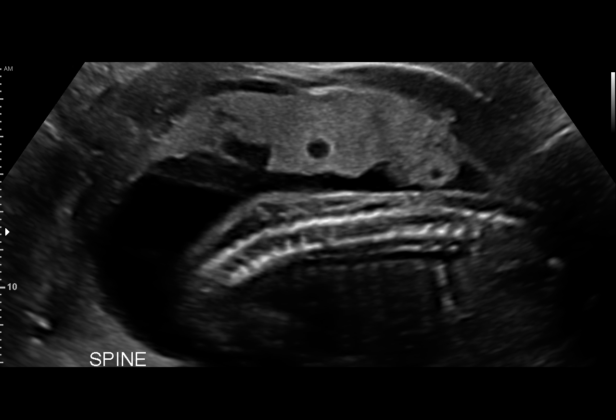
[im 70/111]
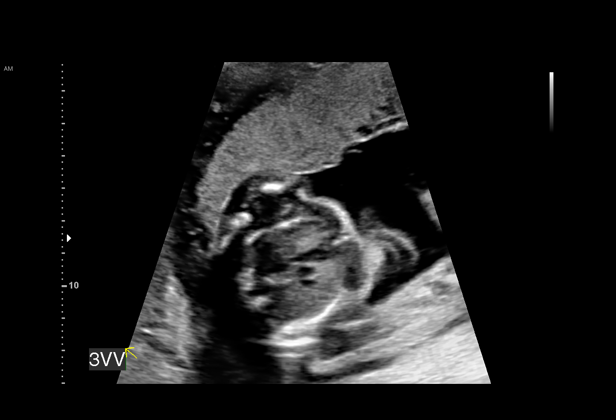
[im 78/111]
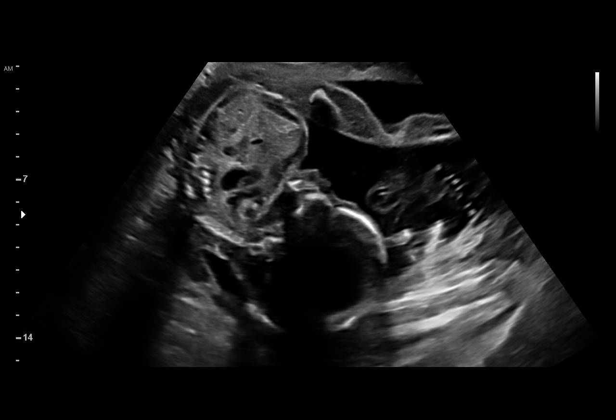
[im 86/111]
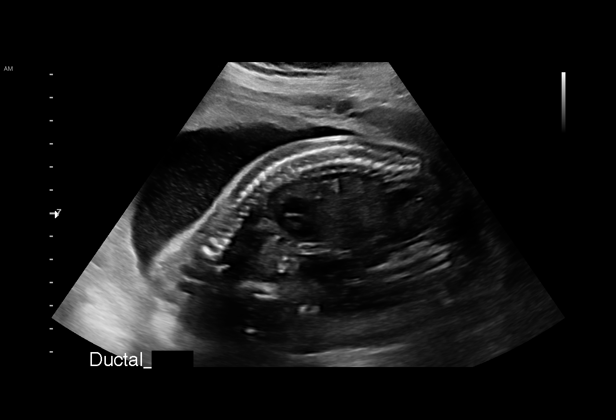
[im 94/111]
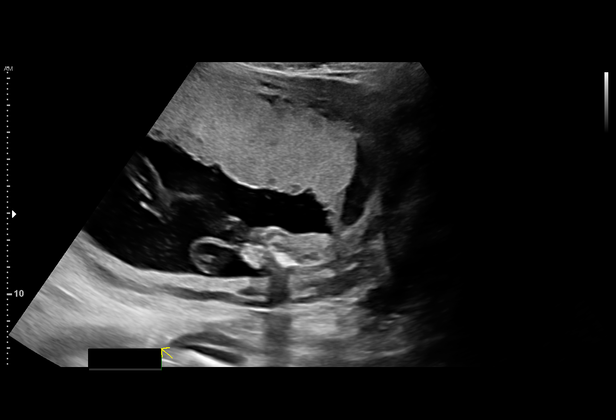
[im 102/111]
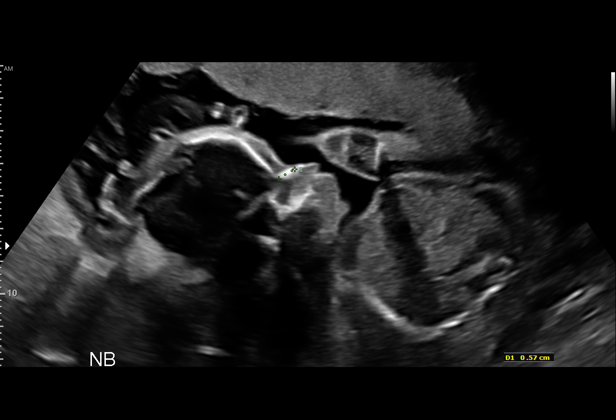
[im 111/111]
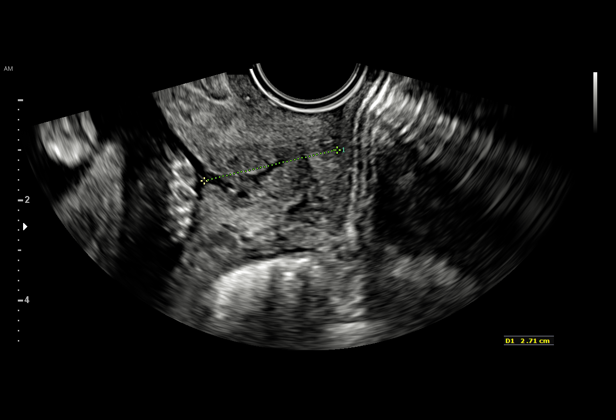

[15 of 28 positions shown; findings below may reference images not displayed]

2  US OB TRANSVAGINAL                    76817.0     ANEEKET BALTAZAR

Indications

 Gestational diabetes in pregnancy,
 unspecified control
 Pregnancy resulting from assisted
 reproductive technology
 LR NIPS/AFP-Neg
 21 weeks gestation of pregnancy
 Cervical cerclage suture present, second
 trimester
 Velamentous insertion of umbilical cord
Fetal Evaluation

 Num Of Fetuses:         1
 Fetal Heart Rate(bpm):  152
 Cardiac Activity:       Observed
 Presentation:           Transverse, head to maternal right
 Placenta:               Anterior
 P. Cord Insertion:      Velamentous insertion

 Amniotic Fluid
 AFI FV:      Within normal limits
Biometry
 BPD:      50.8  mm     G. Age:  21w 3d         59  %    CI:        72.08   %    70 - 86
                                                         FL/HC:      18.2   %    15.9 -
 HC:      190.4  mm     G. Age:  21w 2d         48  %    HC/AC:      1.15        1.06 -
 AC:      165.5  mm     G. Age:  21w 4d         58  %    FL/BPD:     68.3   %
 FL:       34.7  mm     G. Age:  21w 0d         33  %    FL/AC:      21.0   %    20 - 24
 CER:      23.2  mm     G. Age:  21w 4d         74  %
 NFT:       4.4  mm

 LV:        5.7  mm
 CM:        8.2  mm

 Est. FW:     414  gm    0 lb 15 oz      53  %
OB History

 Gravidity:    3         Term:   0        Prem:   0        SAB:   2
 TOP:          0       Ectopic:  0        Living: 0
Gestational Age

 LMP:           20w 6d        Date:  01/30/21                 EDD:   11/06/21
 U/S Today:     21w 2d                                        EDD:   11/03/21
 Best:          21w 1d     Det. By:  Embryo Transfer          EDD:   11/04/21
                                     (02/16/21)
Anatomy

 Cranium:               Appears normal         Aortic Arch:            Appears normal
 Cavum:                 Appears normal         Ductal Arch:            Appears normal
 Ventricles:            Appears normal         Diaphragm:              Appears normal
 Choroid Plexus:        Appears normal         Stomach:                Appears normal, left
                                                                       sided
 Cerebellum:            Appears normal         Abdomen:                Appears normal
 Posterior Fossa:       Appears normal         Abdominal Wall:         Appears nml (cord
                                                                       insert, abd wall)
 Nuchal Fold:           Appears normal         Cord Vessels:           Appears normal (3
                                                                       vessel cord)
 Face:                  Not well visualized    Kidneys:                Appear normal
 Lips:                  Not well visualized    Bladder:                Appears normal
 Thoracic:              Appears normal         Spine:                  Appears normal
 Heart:                 Appears normal         Upper Extremities:      Appears normal
                        (4CH, axis, and
                        situs)
 RVOT:                  Appears normal         Lower Extremities:      Appears normal
 LVOT:                  Appears normal

 Other:  VC, 3VV and 3VTV visualized. Heels/feet and open hands/5th digits
         visualized. Fetus appears to be Male.
Cervix Uterus Adnexa

 Cervix
 Length:            2.7  cm.
 Normal appearance by transabdominal scan.

 Uterus
 No abnormality visualized.
 Right Ovary
 Not visualized.

 Left Ovary
 Not visualized.
Impression

 We performed a fetal anatomical survey.  No markers of
 aneuploidies or fetal structural defects are seen.  Fetal
 biometry is consistent with the previously established dates.
 Amniotic fluid is normal and good fetal activity seen.
 Marginal/velamentous cord insertion is seen.
 We performed transvaginal ultrasound to evaluate the cervix
 and cerclage.  The cervix measures 2.3 2.7 cm (2.7 cm is the
 best measurement) and the cerclage is in place (1.2 cm from
 the external os).
 xxxxxxxxxxxxxxxxxxxxxxxxxxxxxxxxxxxxxxxxxxxxx
 Consultation (see [REDACTED] )

 [facility] the [facility] She is G3 SEEPE at 21-weeks' gestation
 and is here for fetal anatomy scan.
 She conceived by IVF, which she had in [REDACTED].  Patient
 reports she had prophylactic cerclage later in the first
 trimester.  She was told that the cervix was dilated.
 Early screening confirmed gestational diabetes.  Her most
 recent hemoglobin A1c was 6.3%.  3 years ago, the
 hemoglobin A1c was 6.3%
 On cell-free fetal DNA screening, the risks of fetal
 aneuploidies are not increased.  MSAFP screening showed
 low risk for open neural tube defects.
 BP today at our office is 132/77 mm Hg.

 Obstetric history is significant for an ectopic pregnancy, and
 she had laparoscopic right salpingectomy.  She had 1 early
 miscarriage.
 GYN history: PCOS.  No history of abnormal Pap smears or
 cervical surgeries.  No history of breast disease.
 Past medical history: No history of hypertension or any
 chronic medical conditions.
 Past surgical history: Laparoscopy with right salpingectomy.
 Medications: Metformin 500 mg once daily, prenatal vitamins.
 Allergies: No known drug allergies.
 Social history: Denies tobacco or drug or alcohol use.  Her
 husband is in good health.
 Family history: No history of diabetes or venous
 thromboembolism in the family.

 Gestational diabetes (GDM)
 I explained the diagnosis of gestational diabetes. Patient had
 2-hour GTT yesterday and just learned the diagnosis of GDM
 today. Her hemoglobin A1C is below 6.5% (not consistent
 with type 2 diabetes) and the likelihood of congenital
 malformation is very low.

 I emphasized the importance of good blood glucose control
 to prevent adverse fetal or neonatal outcomes.  I discussed
 normal parameters of blood glucose values. I encouraged her
 to check her blood glucose 4 times daily.

 Possible complications of gestational diabetes include fetal
 macrosomia, shoulder dystocia and birth injuries, stillbirth (in
 poorly controlled diabetes) and neonatal respiratory
 syndrome and other complications.

 In about 85% of cases, gestational diabetes is well controlled
 by diet alone.  Exercise reduces the need for insulin.  Medical
 treatment includes oral hypoglycemics or insulin. Patient
 takes metformin now (PCOS). Patient understands that if
 diabetes is not well controlled with metformin, insulin will be
 advised.

 I discussed ultrasound protocol in patients with gestational
 diabetes.
 Timing of delivery: In well-controlled diabetes on diet, patient
 can be delivered at 39 weeksVaginal delivery is not
 contraindicated.
 Type 2 diabetes develops in up to 50% of women with GDM. I
 recommend postpartum screening with 75-g glucose load at
 6 to 12 weeks after delivery.
 Cerclage
 It is unclear the events surrounding the decision to perform
 cerclage procedure in [REDACTED].  Patient does not have
 symptoms of pelvic pressure or vaginal bleeding.  I reassured
 the couple of normal cervical length measurements.
 Marginal/Velamentous cord insertion
 I explained the finding of marginal/velamentous cord insertion
 with help of diagrams.  In most cases, it is not associated with
 adverse outcomes.  We will reassess and follow-up scans.
 Marginal/velamentous cord insertion can be associated with
 fetal growth restriction.
Recommendations

 -An appointment was made for her to return in 4 weeks for
 completion of fetal anatomy.
 -Fetal growth assessments every 4 weeks.
 -Weekly BPP from 32 weeks gestation till delivery.
 -Delivery at 39 weeks gestation.
                 Sindely, Mocsel

## 2024-05-03 NOTE — Progress Notes (Signed)
 Kathryn Navarro                                          MRN: [id number]   05/03/2024   The [facility] Quality Team Specialist reviewed this patient medical record for the purposes of chart review for care gap closure. The following were reviewed: chart review for care gap closure-kidney health evaluation for diabetes:eGFR  and uACR.    [facility] Quality Team
